# Patient Record
Sex: Male | Born: 1955
Health system: Southern US, Community
[De-identification: ages and names within clinical notes are randomized; demographics above are authoritative.]

## PROBLEM LIST (undated history)

## (undated) DIAGNOSIS — I10 Essential (primary) hypertension: Secondary | ICD-10-CM

## (undated) DIAGNOSIS — E119 Type 2 diabetes mellitus without complications: Secondary | ICD-10-CM

## (undated) HISTORY — PX: NO PAST SURGERIES: SHX2092

---

## 1999-04-01 ENCOUNTER — Encounter: Admission: RE | Admit: 1999-04-01 | Discharge: 1999-06-30 | Payer: Self-pay | Admitting: Family Medicine

## 2004-06-16 ENCOUNTER — Ambulatory Visit: Payer: Self-pay | Admitting: Gastroenterology

## 2004-06-30 ENCOUNTER — Ambulatory Visit: Payer: Self-pay | Admitting: Gastroenterology

## 2004-07-22 ENCOUNTER — Encounter: Admission: RE | Admit: 2004-07-22 | Discharge: 2004-07-22 | Payer: Self-pay | Admitting: Family Medicine

## 2004-10-05 ENCOUNTER — Ambulatory Visit (HOSPITAL_BASED_OUTPATIENT_CLINIC_OR_DEPARTMENT_OTHER): Admission: RE | Admit: 2004-10-05 | Discharge: 2004-10-05 | Payer: Self-pay | Admitting: Family Medicine

## 2004-10-12 ENCOUNTER — Ambulatory Visit: Payer: Self-pay | Admitting: Internal Medicine

## 2009-06-18 ENCOUNTER — Encounter (INDEPENDENT_AMBULATORY_CARE_PROVIDER_SITE_OTHER): Payer: Self-pay | Admitting: *Deleted

## 2010-09-02 NOTE — Letter (Signed)
 Summary: Colonoscopy Date Change Letter  [facility]  [address] [place], [place] [address]   Phone: [phone]  Fax: [phone]      June 18, 2009 MRN: [id number]   Michael Franco [address] [place], [place]  [address]   Dear [name],   Previously you were recommended to have a repeat colonoscopy around this time. Your chart was recently reviewed by Dr. Arvie Franco of [facility]. Follow up colonoscopy is now recommended in Dec 2015 . This revised recommendation is based on current, nationally recognized guidelines for colorectal cancer screening and polyp surveillance. These guidelines are endorsed by the American Cancer Society, The [place]  Multi-Society Task Force on Colorectal Cancer as well as numerous other major medical organizations.  Please understand that our recommendation assumes that you do not have any new symptoms such as bleeding, a change in bowel habits, anemia, or significant abdominal discomfort. If you do have any concerning GI symptoms or want to discuss the guideline recommendations, please call to arrange an office visit at your earliest convenience. Otherwise we will keep you in our reminder system and contact you 1-2 months prior to the date listed above to schedule your next colonoscopy.  Thank you,  Michael Franco. Michael Franco, M.D.  [facility] [phone]  Appended Document: Colonoscopy Date Change Letter 07-02-09 Resent letter with corrected mailing addresss to [address] Michael Franco. Michael Franco

## 2013-06-22 ENCOUNTER — Ambulatory Visit
Admission: RE | Admit: 2013-06-22 | Discharge: 2013-06-22 | Disposition: A | Discharge status: Home / Self Care | Payer: 59 | Source: Ambulatory Visit | Attending: Physician Assistant | Admitting: Physician Assistant

## 2013-06-22 ENCOUNTER — Other Ambulatory Visit: Payer: Self-pay | Admitting: Physician Assistant

## 2013-06-22 DIAGNOSIS — R52 Pain, unspecified: Secondary | ICD-10-CM

## 2014-04-26 ENCOUNTER — Encounter: Payer: Self-pay | Admitting: Gastroenterology

## 2014-12-10 ENCOUNTER — Encounter: Payer: Self-pay | Admitting: Gastroenterology

## 2014-12-31 IMAGING — CR DG TOE GREAT 2+V*R*
5 series · 5 of 5 positions shown · non-contrast
Comparison: None.

CLINICAL DATA: Barbell fell on big toe today with contusion

EXAM:
RIGHT GREAT TOE

[view not recorded (1 of 5)]
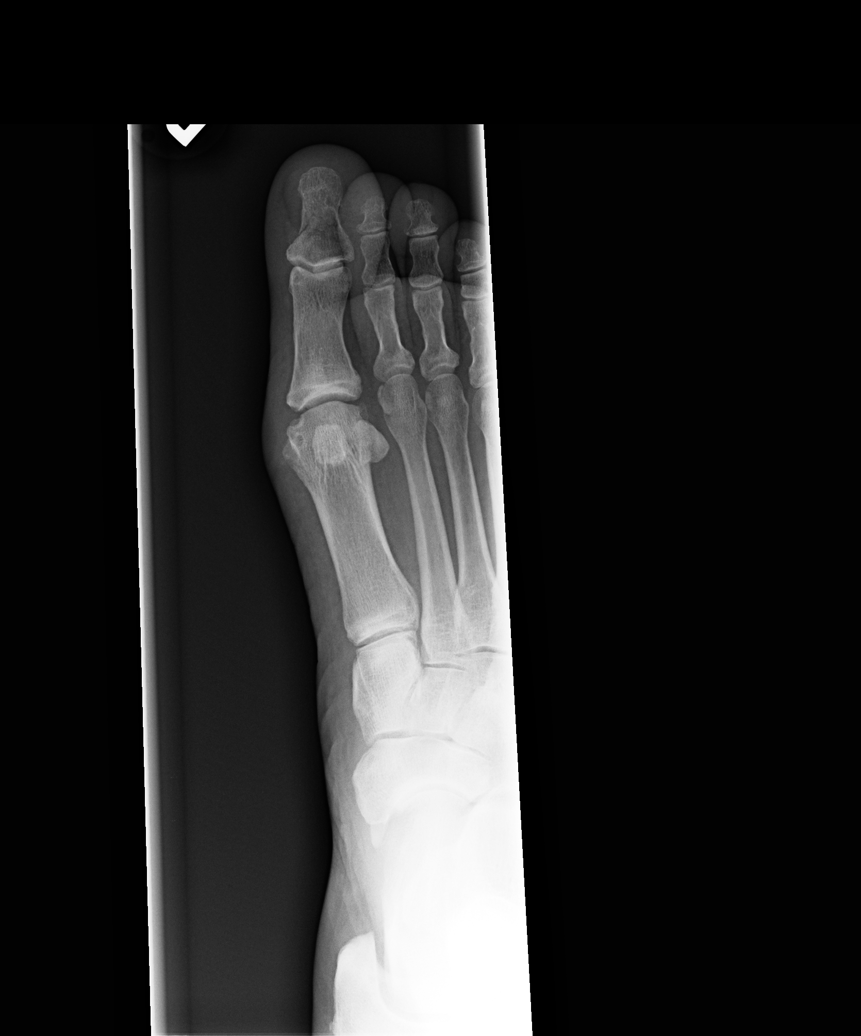

[view not recorded (2 of 5)]
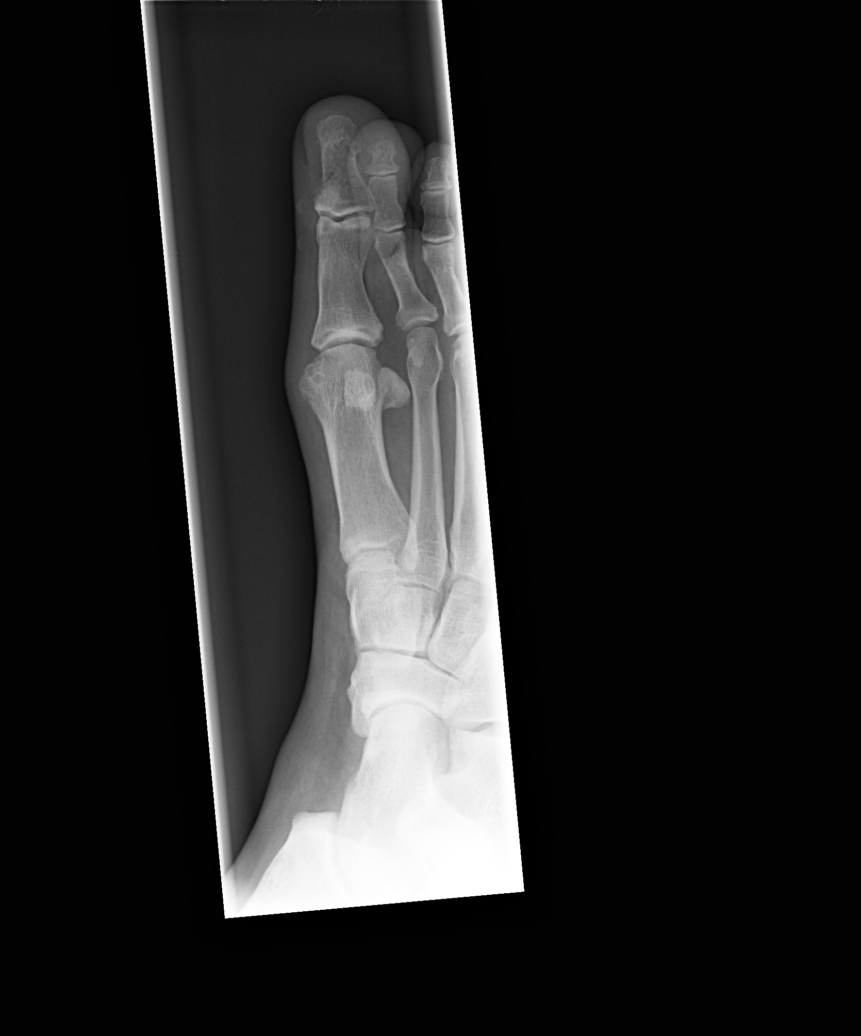

[view not recorded (3 of 5)]
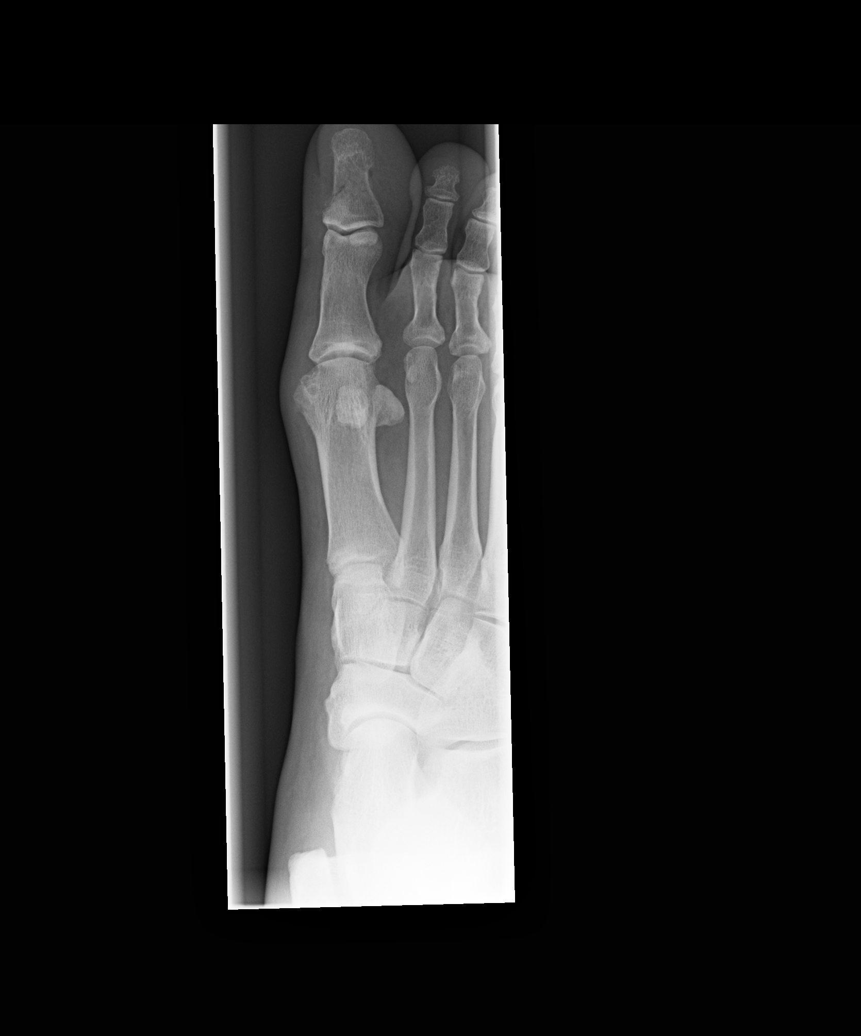

[view not recorded (4 of 5)]
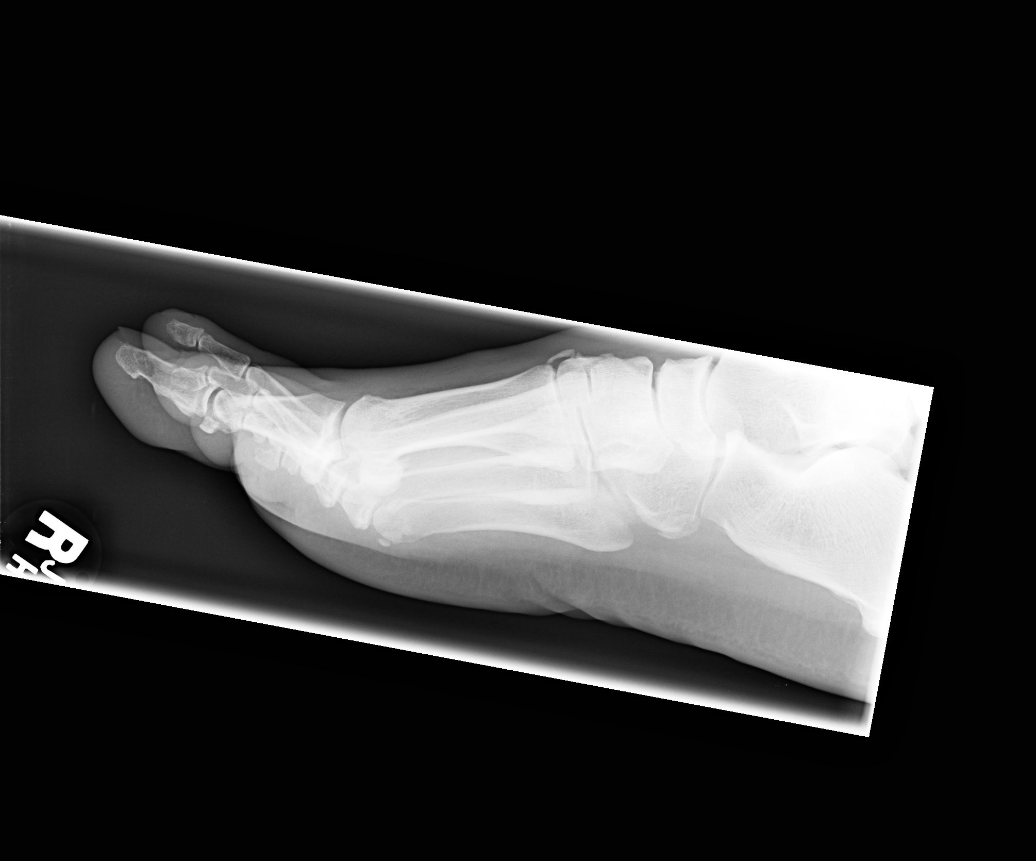

[view not recorded (5 of 5)]
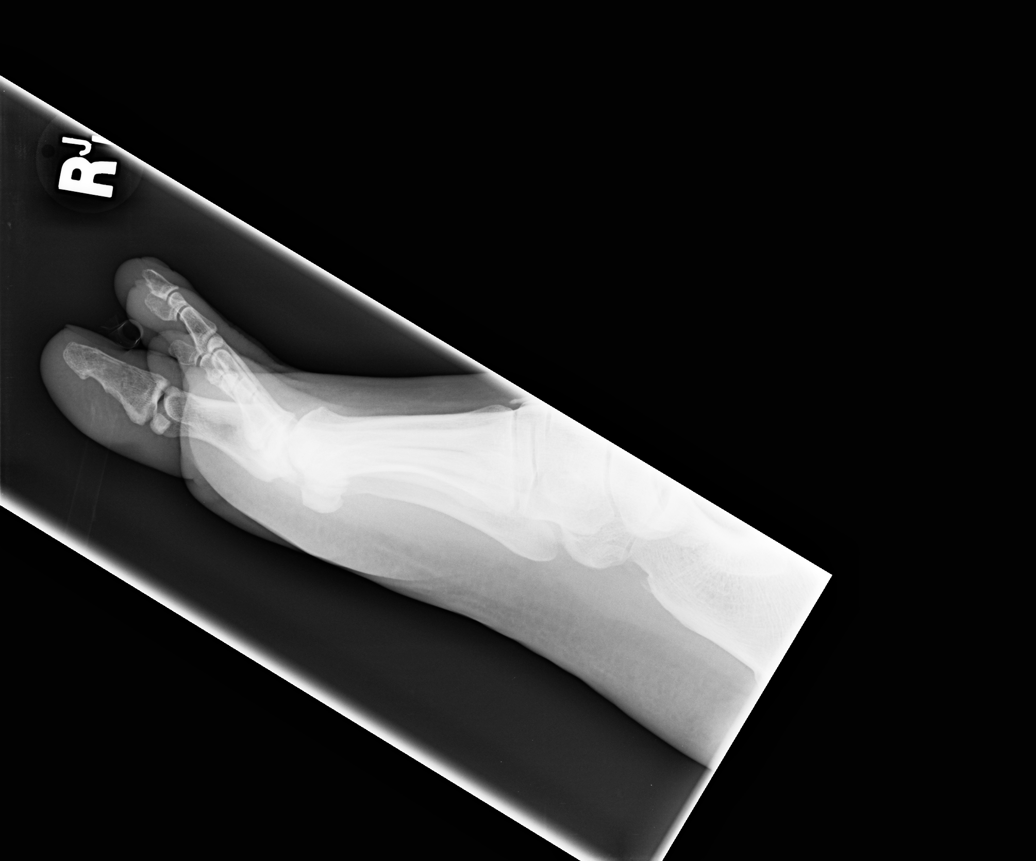

[5 of 5 positions shown; findings below may reference images not displayed]

FINDINGS: There is a comminuted nondisplaced fracture of the mid aspect of the
distal phalanx of the right great toe. This fracture does not appear
to involve the DIP joint space. No other abnormality is seen.
IMPRESSION: Nondisplaced fracture of the midportion of the distal phalanx of the
right great toe.

## 2016-09-03 DIAGNOSIS — G4733 Obstructive sleep apnea (adult) (pediatric): Secondary | ICD-10-CM | POA: Diagnosis not present

## 2016-09-23 DIAGNOSIS — E119 Type 2 diabetes mellitus without complications: Secondary | ICD-10-CM | POA: Diagnosis not present

## 2016-09-23 DIAGNOSIS — E7800 Pure hypercholesterolemia, unspecified: Secondary | ICD-10-CM | POA: Diagnosis not present

## 2016-09-23 DIAGNOSIS — I10 Essential (primary) hypertension: Secondary | ICD-10-CM | POA: Diagnosis not present

## 2016-10-06 DIAGNOSIS — D631 Anemia in chronic kidney disease: Secondary | ICD-10-CM | POA: Diagnosis not present

## 2016-10-06 DIAGNOSIS — N181 Chronic kidney disease, stage 1: Secondary | ICD-10-CM | POA: Diagnosis not present

## 2016-10-06 DIAGNOSIS — N2581 Secondary hyperparathyroidism of renal origin: Secondary | ICD-10-CM | POA: Diagnosis not present

## 2016-10-06 DIAGNOSIS — N189 Chronic kidney disease, unspecified: Secondary | ICD-10-CM | POA: Diagnosis not present

## 2016-12-21 DIAGNOSIS — E1165 Type 2 diabetes mellitus with hyperglycemia: Secondary | ICD-10-CM | POA: Diagnosis not present

## 2016-12-21 DIAGNOSIS — E7800 Pure hypercholesterolemia, unspecified: Secondary | ICD-10-CM | POA: Diagnosis not present

## 2016-12-21 DIAGNOSIS — I10 Essential (primary) hypertension: Secondary | ICD-10-CM | POA: Diagnosis not present

## 2017-01-12 DIAGNOSIS — I10 Essential (primary) hypertension: Secondary | ICD-10-CM | POA: Diagnosis not present

## 2017-01-12 DIAGNOSIS — E7800 Pure hypercholesterolemia, unspecified: Secondary | ICD-10-CM | POA: Diagnosis not present

## 2017-03-03 DIAGNOSIS — G4733 Obstructive sleep apnea (adult) (pediatric): Secondary | ICD-10-CM | POA: Diagnosis not present

## 2017-03-29 DIAGNOSIS — E7800 Pure hypercholesterolemia, unspecified: Secondary | ICD-10-CM | POA: Diagnosis not present

## 2017-03-29 DIAGNOSIS — E1165 Type 2 diabetes mellitus with hyperglycemia: Secondary | ICD-10-CM | POA: Diagnosis not present

## 2017-03-29 DIAGNOSIS — I10 Essential (primary) hypertension: Secondary | ICD-10-CM | POA: Diagnosis not present

## 2017-07-05 DIAGNOSIS — E1165 Type 2 diabetes mellitus with hyperglycemia: Secondary | ICD-10-CM | POA: Diagnosis not present

## 2017-07-05 DIAGNOSIS — M109 Gout, unspecified: Secondary | ICD-10-CM | POA: Diagnosis not present

## 2017-07-05 DIAGNOSIS — I10 Essential (primary) hypertension: Secondary | ICD-10-CM | POA: Diagnosis not present

## 2017-11-12 DIAGNOSIS — D631 Anemia in chronic kidney disease: Secondary | ICD-10-CM | POA: Diagnosis not present

## 2017-11-12 DIAGNOSIS — I10 Essential (primary) hypertension: Secondary | ICD-10-CM | POA: Diagnosis not present

## 2017-11-12 DIAGNOSIS — N2581 Secondary hyperparathyroidism of renal origin: Secondary | ICD-10-CM | POA: Diagnosis not present

## 2017-11-12 DIAGNOSIS — N189 Chronic kidney disease, unspecified: Secondary | ICD-10-CM | POA: Diagnosis not present

## 2017-11-12 DIAGNOSIS — N181 Chronic kidney disease, stage 1: Secondary | ICD-10-CM | POA: Diagnosis not present

## 2018-01-10 DIAGNOSIS — E7800 Pure hypercholesterolemia, unspecified: Secondary | ICD-10-CM | POA: Diagnosis not present

## 2018-01-10 DIAGNOSIS — I10 Essential (primary) hypertension: Secondary | ICD-10-CM | POA: Diagnosis not present

## 2018-01-10 DIAGNOSIS — E119 Type 2 diabetes mellitus without complications: Secondary | ICD-10-CM | POA: Diagnosis not present

## 2018-01-10 DIAGNOSIS — Z125 Encounter for screening for malignant neoplasm of prostate: Secondary | ICD-10-CM | POA: Diagnosis not present

## 2018-03-18 DIAGNOSIS — L509 Urticaria, unspecified: Secondary | ICD-10-CM | POA: Diagnosis not present

## 2018-03-18 DIAGNOSIS — E119 Type 2 diabetes mellitus without complications: Secondary | ICD-10-CM | POA: Diagnosis not present

## 2018-03-22 DIAGNOSIS — L509 Urticaria, unspecified: Secondary | ICD-10-CM | POA: Diagnosis not present

## 2018-07-11 DIAGNOSIS — E785 Hyperlipidemia, unspecified: Secondary | ICD-10-CM | POA: Diagnosis not present

## 2018-07-11 DIAGNOSIS — I10 Essential (primary) hypertension: Secondary | ICD-10-CM | POA: Diagnosis not present

## 2018-07-11 DIAGNOSIS — E119 Type 2 diabetes mellitus without complications: Secondary | ICD-10-CM | POA: Diagnosis not present

## 2018-09-25 ENCOUNTER — Other Ambulatory Visit: Payer: Self-pay

## 2018-09-25 ENCOUNTER — Observation Stay (HOSPITAL_COMMUNITY)
Admission: EM | Admit: 2018-09-25 | Discharge: 2018-09-27 | Disposition: A | Discharge status: Home / Self Care | Payer: 59 | Attending: Internal Medicine | Admitting: Internal Medicine

## 2018-09-25 ENCOUNTER — Emergency Department (HOSPITAL_COMMUNITY): Payer: 59

## 2018-09-25 ENCOUNTER — Encounter (HOSPITAL_COMMUNITY): Payer: Self-pay | Admitting: Emergency Medicine

## 2018-09-25 DIAGNOSIS — R112 Nausea with vomiting, unspecified: Secondary | ICD-10-CM | POA: Diagnosis not present

## 2018-09-25 DIAGNOSIS — N200 Calculus of kidney: Secondary | ICD-10-CM | POA: Diagnosis not present

## 2018-09-25 DIAGNOSIS — D72829 Elevated white blood cell count, unspecified: Secondary | ICD-10-CM | POA: Insufficient documentation

## 2018-09-25 DIAGNOSIS — R1115 Cyclical vomiting syndrome unrelated to migraine: Secondary | ICD-10-CM

## 2018-09-25 DIAGNOSIS — E119 Type 2 diabetes mellitus without complications: Secondary | ICD-10-CM | POA: Insufficient documentation

## 2018-09-25 DIAGNOSIS — R109 Unspecified abdominal pain: Secondary | ICD-10-CM | POA: Diagnosis present

## 2018-09-25 DIAGNOSIS — Z79899 Other long term (current) drug therapy: Secondary | ICD-10-CM | POA: Insufficient documentation

## 2018-09-25 DIAGNOSIS — N210 Calculus in bladder: Secondary | ICD-10-CM | POA: Insufficient documentation

## 2018-09-25 DIAGNOSIS — Z7984 Long term (current) use of oral hypoglycemic drugs: Secondary | ICD-10-CM | POA: Diagnosis not present

## 2018-09-25 DIAGNOSIS — Z7982 Long term (current) use of aspirin: Secondary | ICD-10-CM | POA: Insufficient documentation

## 2018-09-25 DIAGNOSIS — R1114 Bilious vomiting: Secondary | ICD-10-CM

## 2018-09-25 DIAGNOSIS — N289 Disorder of kidney and ureter, unspecified: Secondary | ICD-10-CM

## 2018-09-25 DIAGNOSIS — N132 Hydronephrosis with renal and ureteral calculous obstruction: Secondary | ICD-10-CM | POA: Diagnosis not present

## 2018-09-25 DIAGNOSIS — D649 Anemia, unspecified: Secondary | ICD-10-CM | POA: Diagnosis not present

## 2018-09-25 DIAGNOSIS — I10 Essential (primary) hypertension: Secondary | ICD-10-CM | POA: Insufficient documentation

## 2018-09-25 DIAGNOSIS — E860 Dehydration: Secondary | ICD-10-CM

## 2018-09-25 DIAGNOSIS — R1031 Right lower quadrant pain: Secondary | ICD-10-CM | POA: Diagnosis not present

## 2018-09-25 DIAGNOSIS — R197 Diarrhea, unspecified: Secondary | ICD-10-CM

## 2018-09-25 DIAGNOSIS — N179 Acute kidney failure, unspecified: Secondary | ICD-10-CM | POA: Diagnosis not present

## 2018-09-25 DIAGNOSIS — R1011 Right upper quadrant pain: Secondary | ICD-10-CM

## 2018-09-25 HISTORY — DX: Essential (primary) hypertension: I10

## 2018-09-25 HISTORY — DX: Type 2 diabetes mellitus without complications: E11.9

## 2018-09-25 LAB — CBC
HCT: 36.4 % — ABNORMAL LOW (ref 39.0–52.0)
Hemoglobin: 12.2 g/dL — ABNORMAL LOW (ref 13.0–17.0)
MCH: 30.1 pg (ref 26.0–34.0)
MCHC: 33.5 g/dL (ref 30.0–36.0)
MCV: 89.9 fL (ref 80.0–100.0)
Platelets: 241 10*3/uL (ref 150–400)
RBC: 4.05 MIL/uL — ABNORMAL LOW (ref 4.22–5.81)
RDW: 12.3 % (ref 11.5–15.5)
WBC: 15 10*3/uL — ABNORMAL HIGH (ref 4.0–10.5)
nRBC: 0 % (ref 0.0–0.2)

## 2018-09-25 LAB — URINALYSIS, ROUTINE W REFLEX MICROSCOPIC
Bacteria, UA: NONE SEEN
Bilirubin Urine: NEGATIVE
Glucose, UA: 150 mg/dL — AB
Hgb urine dipstick: NEGATIVE
Ketones, ur: NEGATIVE mg/dL
Leukocytes,Ua: NEGATIVE
Nitrite: NEGATIVE
Protein, ur: 30 mg/dL — AB
Specific Gravity, Urine: 1.014 (ref 1.005–1.030)
pH: 7 (ref 5.0–8.0)

## 2018-09-25 LAB — COMPREHENSIVE METABOLIC PANEL
ALT: 17 U/L (ref 0–44)
AST: 25 U/L (ref 15–41)
Albumin: 4.2 g/dL (ref 3.5–5.0)
Alkaline Phosphatase: 54 U/L (ref 38–126)
Anion gap: 14 (ref 5–15)
BUN: 15 mg/dL (ref 8–23)
CO2: 22 mmol/L (ref 22–32)
Calcium: 9.5 mg/dL (ref 8.9–10.3)
Chloride: 102 mmol/L (ref 98–111)
Creatinine, Ser: 2.42 mg/dL — ABNORMAL HIGH (ref 0.61–1.24)
GFR calc Af Amer: 32 mL/min — ABNORMAL LOW (ref >60)
GFR calc non Af Amer: 27 mL/min — ABNORMAL LOW (ref >60)
Glucose, Bld: 244 mg/dL — ABNORMAL HIGH (ref 70–99)
Potassium: 4.3 mmol/L (ref 3.5–5.1)
Sodium: 138 mmol/L (ref 135–145)
Total Bilirubin: 1 mg/dL (ref 0.3–1.2)
Total Protein: 7.2 g/dL (ref 6.5–8.1)

## 2018-09-25 LAB — LIPASE, BLOOD: Lipase: 19 U/L (ref 11–51)

## 2018-09-25 LAB — GLUCOSE, CAPILLARY
Glucose-Capillary: 194 mg/dL — ABNORMAL HIGH (ref 70–99)
Glucose-Capillary: 150 mg/dL — ABNORMAL HIGH (ref 70–99)

## 2018-09-25 MED ORDER — AMLODIPINE BESYLATE 10 MG PO TABS
10.0000 mg | ORAL_TABLET | Freq: Every day | ORAL | Status: DC
  Administered 2018-09-25 – 2018-09-27 (×3): 10 mg via ORAL
  Filled 2018-09-25 (×2): qty 2
  Filled 2018-09-25: qty 1

## 2018-09-25 MED ORDER — INSULIN ASPART 100 UNIT/ML ~~LOC~~ SOLN: 0.0000 [IU] | Freq: Every day | SUBCUTANEOUS | Status: DC

## 2018-09-25 MED ORDER — ONDANSETRON HCL 4 MG/2ML IJ SOLN
4.0000 mg | Freq: Once | INTRAMUSCULAR | Status: AC
  Administered 2018-09-25: 4 mg via INTRAVENOUS
  Filled 2018-09-25: qty 2

## 2018-09-25 MED ORDER — ACETAMINOPHEN 325 MG PO TABS
650.0000 mg | ORAL_TABLET | Freq: Four times a day (QID) | ORAL | Status: DC | PRN
  Administered 2018-09-25: 650 mg via ORAL
  Filled 2018-09-25: qty 2

## 2018-09-25 MED ORDER — HEPARIN SODIUM (PORCINE) 5000 UNIT/ML IJ SOLN
5000.0000 [IU] | Freq: Three times a day (TID) | INTRAMUSCULAR | Status: DC
  Filled 2018-09-25 (×2): qty 1

## 2018-09-25 MED ORDER — SODIUM CHLORIDE 0.9 % IV BOLUS
1000.0000 mL | Freq: Once | INTRAVENOUS | Status: AC
  Administered 2018-09-25: 1000 mL via INTRAVENOUS

## 2018-09-25 MED ORDER — ACETAMINOPHEN 650 MG RE SUPP: 650.0000 mg | Freq: Four times a day (QID) | RECTAL | Status: DC | PRN

## 2018-09-25 MED ORDER — SODIUM CHLORIDE 0.9% FLUSH: 3.0000 mL | Freq: Once | INTRAVENOUS | Status: DC

## 2018-09-25 MED ORDER — SODIUM CHLORIDE 0.9 % IV SOLN
INTRAVENOUS | Status: DC
  Administered 2018-09-25 – 2018-09-27 (×4): via INTRAVENOUS

## 2018-09-25 MED ORDER — ONDANSETRON HCL 4 MG/2ML IJ SOLN
4.0000 mg | Freq: Once | INTRAMUSCULAR | Status: AC | PRN
  Administered 2018-09-25: 4 mg via INTRAVENOUS
  Filled 2018-09-25: qty 2

## 2018-09-25 MED ORDER — MORPHINE SULFATE (PF) 4 MG/ML IV SOLN
4.0000 mg | Freq: Once | INTRAVENOUS | Status: AC
  Administered 2018-09-25: 4 mg via INTRAVENOUS
  Filled 2018-09-25: qty 1

## 2018-09-25 MED ORDER — DOXAZOSIN MESYLATE 4 MG PO TABS
4.0000 mg | ORAL_TABLET | Freq: Every day | ORAL | Status: DC
  Administered 2018-09-25 – 2018-09-27 (×3): 4 mg via ORAL
  Filled 2018-09-25: qty 1
  Filled 2018-09-25 (×2): qty 2
  Filled 2018-09-25 (×2): qty 1

## 2018-09-25 MED ORDER — ATENOLOL 50 MG PO TABS
50.0000 mg | ORAL_TABLET | Freq: Every day | ORAL | Status: DC
  Administered 2018-09-25 – 2018-09-27 (×3): 50 mg via ORAL
  Filled 2018-09-25 (×3): qty 1

## 2018-09-25 MED ORDER — TAMSULOSIN HCL 0.4 MG PO CAPS
0.4000 mg | ORAL_CAPSULE | Freq: Every day | ORAL | Status: DC
  Administered 2018-09-25: 0.4 mg via ORAL
  Filled 2018-09-25: qty 1

## 2018-09-25 MED ORDER — ASPIRIN EC 81 MG PO TBEC
81.0000 mg | DELAYED_RELEASE_TABLET | Freq: Every day | ORAL | Status: DC
  Administered 2018-09-25 – 2018-09-27 (×3): 81 mg via ORAL
  Filled 2018-09-25 (×3): qty 1

## 2018-09-25 MED ORDER — TRAMADOL HCL 50 MG PO TABS
50.0000 mg | ORAL_TABLET | Freq: Four times a day (QID) | ORAL | Status: DC | PRN
  Administered 2018-09-25 – 2018-09-26 (×2): 50 mg via ORAL
  Filled 2018-09-25 (×3): qty 1

## 2018-09-25 MED ORDER — ONDANSETRON HCL 4 MG PO TABS: 4.0000 mg | ORAL_TABLET | Freq: Four times a day (QID) | ORAL | Status: DC | PRN

## 2018-09-25 MED ORDER — INSULIN ASPART 100 UNIT/ML ~~LOC~~ SOLN
0.0000 [IU] | Freq: Three times a day (TID) | SUBCUTANEOUS | Status: DC
  Administered 2018-09-25: 1 [IU] via SUBCUTANEOUS
  Administered 2018-09-25: 2 [IU] via SUBCUTANEOUS
  Administered 2018-09-26: 3 [IU] via SUBCUTANEOUS
  Administered 2018-09-27: 2 [IU] via SUBCUTANEOUS

## 2018-09-25 MED ORDER — ONDANSETRON HCL 4 MG/2ML IJ SOLN
4.0000 mg | Freq: Four times a day (QID) | INTRAMUSCULAR | Status: DC | PRN
  Administered 2018-09-26 (×2): 4 mg via INTRAVENOUS
  Filled 2018-09-25 (×2): qty 2

## 2018-09-25 MED ORDER — METOCLOPRAMIDE HCL 5 MG/ML IJ SOLN
10.0000 mg | Freq: Once | INTRAMUSCULAR | Status: AC
  Administered 2018-09-25: 10 mg via INTRAVENOUS
  Filled 2018-09-25: qty 2

## 2018-09-25 NOTE — Progress Notes (Signed)
Pt educated on needing to strain all urine  Provided pt with urinal and stated to call RN when he voids  Pt verbalizes understanding

## 2018-09-25 NOTE — Consult Note (Signed)
Urology Consult   Physician requesting consult: Dr. Jerral RalphGhimire  Reason for consult: AKI and right ureteral stone  History of Present Illness: Michael LoweMichael Franco is a 63 y.o. with no prior history of known urolithiasis who presented to the ED today with complaints of severe RLQ abdominal pain associated with nausea and vomiting.  He has denied any fever.  CT imaging revealed a small 2-3 mm stone at the right UVJ along with another 5 mm left sided bladder stone that appeared to be away from the left ureteral orifice.  There was right sided hydronephrosis.  His pain was able to be controlled but he was noted to have an elevated Cr of 2.4 and was admitted by Medicine for this reason.  His baseline Cr is not available but he states that he has never been told that he had abnormal renal function.  He last saw Dr. Holley Boucheandall Franco, his PCP, for a routine physician a few months ago and presumably had normal renal function at that time.  He does have a history of diabetes and hypertension.  He already takes an alpha blocker for hypertension.  He denies a history of voiding or storage urinary symptoms, hematuria, UTIs, STDs, urolithiasis, GU malignancy/trauma/surgery.  Past Medical History:  Diagnosis Date  . Diabetes mellitus without complication (HCC)   . Hypertension     History reviewed. No pertinent surgical history.  Current Hospital Medications:  Home Meds:  No current facility-administered medications on file prior to encounter.    Current Outpatient Medications on File Prior to Encounter  Medication Sig Dispense Refill  . amLODipine (NORVASC) 10 MG tablet Take 10 mg by mouth daily.    Marland Kitchen. aspirin EC 81 MG tablet Take 81 mg by mouth daily.    Marland Kitchen. atenolol (TENORMIN) 50 MG tablet Take 50 mg by mouth daily.    Marland Kitchen. doxazosin (CARDURA) 4 MG tablet Take 4 mg by mouth daily.    . irbesartan (AVAPRO) 300 MG tablet Take 300 mg by mouth daily.    . metFORMIN (GLUCOPHAGE) 1000 MG tablet Take 1,000 mg by mouth 2  (two) times daily.       Scheduled Meds: . amLODipine  10 mg Oral Daily  . aspirin EC  81 mg Oral Daily  . atenolol  50 mg Oral Daily  . doxazosin  4 mg Oral Daily  . heparin  5,000 Units Subcutaneous Q8H  . insulin aspart  0-5 Units Subcutaneous QHS  . insulin aspart  0-9 Units Subcutaneous TID WC  . sodium chloride flush  3 mL Intravenous Once  . tamsulosin  0.4 mg Oral QPC supper   Continuous Infusions: . sodium chloride 125 mL/hr at 09/25/18 1203   PRN Meds:.acetaminophen **OR** acetaminophen, ondansetron **OR** ondansetron (ZOFRAN) IV, traMADol  Allergies: No Known Allergies  History reviewed. No pertinent family history.  Social History:  reports that he has never smoked. He has never used smokeless tobacco. He reports that he does not drink alcohol or use drugs.  ROS: A complete review of systems was performed.  All systems are negative except for pertinent findings as noted.  Physical Exam:  Vital signs in last 24 hours: Temp:  [98.3 F (36.8 C)-99.1 F (37.3 C)] 99.1 F (37.3 C) (02/23 1147) Pulse Rate:  [70-87] 70 (02/23 1147) Resp:  [18] 18 (02/23 1147) BP: (170-179)/(85-92) 172/89 (02/23 1147) SpO2:  [95 %-99 %] 97 % (02/23 1147) Weight:  [81.6 kg] 81.6 kg (02/23 0640) Constitutional:  Alert and oriented, No acute distress Cardiovascular:  Regular rate and rhythm, No JVD Respiratory: Normal respiratory effort, Lungs clear bilaterally GI: Abdomen is soft, nontender, nondistended, no abdominal masses GU: No CVA tenderness Lymphatic: No lymphadenopathy Neurologic: Grossly intact, no focal deficits Psychiatric: Normal mood and affect  Laboratory Data:  Recent Labs    09/25/18 0643  WBC 15.0*  HGB 12.2*  HCT 36.4*  PLT 241    Recent Labs    09/25/18 0643  NA 138  K 4.3  CL 102  GLUCOSE 244*  BUN 15  CALCIUM 9.5  CREATININE 2.42*     Results for orders placed or performed during the hospital encounter of 09/25/18 (from the past 24 hour(s))   Lipase, blood     Status: None   Collection Time: 09/25/18  6:43 AM  Result Value Ref Range   Lipase 19 11 - 51 U/L  Comprehensive metabolic panel     Status: Abnormal   Collection Time: 09/25/18  6:43 AM  Result Value Ref Range   Sodium 138 135 - 145 mmol/L   Potassium 4.3 3.5 - 5.1 mmol/L   Chloride 102 98 - 111 mmol/L   CO2 22 22 - 32 mmol/L   Glucose, Bld 244 (H) 70 - 99 mg/dL   BUN 15 8 - 23 mg/dL   Creatinine, Ser 4.98 (H) 0.61 - 1.24 mg/dL   Calcium 9.5 8.9 - 26.4 mg/dL   Total Protein 7.2 6.5 - 8.1 g/dL   Albumin 4.2 3.5 - 5.0 g/dL   AST 25 15 - 41 U/L   ALT 17 0 - 44 U/L   Alkaline Phosphatase 54 38 - 126 U/L   Total Bilirubin 1.0 0.3 - 1.2 mg/dL   GFR calc non Af Amer 27 (L) >60 mL/min   GFR calc Af Amer 32 (L) >60 mL/min   Anion gap 14 5 - 15  CBC     Status: Abnormal   Collection Time: 09/25/18  6:43 AM  Result Value Ref Range   WBC 15.0 (H) 4.0 - 10.5 K/uL   RBC 4.05 (L) 4.22 - 5.81 MIL/uL   Hemoglobin 12.2 (L) 13.0 - 17.0 g/dL   HCT 15.8 (L) 30.9 - 40.7 %   MCV 89.9 80.0 - 100.0 fL   MCH 30.1 26.0 - 34.0 pg   MCHC 33.5 30.0 - 36.0 g/dL   RDW 68.0 88.1 - 10.3 %   Platelets 241 150 - 400 K/uL   nRBC 0.0 0.0 - 0.2 %  Urinalysis, Routine w reflex microscopic     Status: Abnormal   Collection Time: 09/25/18  9:26 AM  Result Value Ref Range   Color, Urine YELLOW YELLOW   APPearance CLEAR CLEAR   Specific Gravity, Urine 1.014 1.005 - 1.030   pH 7.0 5.0 - 8.0   Glucose, UA 150 (A) NEGATIVE mg/dL   Hgb urine dipstick NEGATIVE NEGATIVE   Bilirubin Urine NEGATIVE NEGATIVE   Ketones, ur NEGATIVE NEGATIVE mg/dL   Protein, ur 30 (A) NEGATIVE mg/dL   Nitrite NEGATIVE NEGATIVE   Leukocytes,Ua NEGATIVE NEGATIVE   RBC / HPF 0-5 0 - 5 RBC/hpf   WBC, UA 0-5 0 - 5 WBC/hpf   Bacteria, UA NONE SEEN NONE SEEN   Squamous Epithelial / LPF 0-5 0 - 5   Mucus PRESENT   Glucose, capillary     Status: Abnormal   Collection Time: 09/25/18 11:46 AM  Result Value Ref Range    Glucose-Capillary 194 (H) 70 - 99 mg/dL   No results found for this  or any previous visit (from the past 240 hour(s)).  Renal Function: Recent Labs    09/25/18 0643  CREATININE 2.42*   Estimated Creatinine Clearance: 30.8 mL/min (A) (by C-G formula based on SCr of 2.42 mg/dL (H)).  Radiologic Imaging: Ct Abdomen Pelvis Wo Contrast  Result Date: 09/25/2018 CLINICAL DATA:  Right-sided pain with nausea and vomiting since 3 a.m. EXAM: CT ABDOMEN AND PELVIS WITHOUT CONTRAST TECHNIQUE: Multidetector CT imaging of the abdomen and pelvis was performed following the standard protocol without IV contrast. COMPARISON:  07/22/2004 CTA FINDINGS: Lower chest: Clear lung bases. Normal heart size without pericardial or pleural effusion. Tiny hiatal hernia. Hepatobiliary: Normal liver. Normal gallbladder, without biliary ductal dilatation. Pancreas: Normal, without mass or ductal dilatation. Spleen: Normal in size, without focal abnormality. Adrenals/Urinary Tract: Normal adrenal glands. Possible punctate lower pole left renal collecting system calculus. Moderate right sided perinephric edema with mild hydronephrosis and hydroureter. The hydroureter is followed to the level of the urinary bladder. There are 2 bladder stones, including at 3 mm in the right paracentral bladder base and 5 mm in the left bladder base. Stomach/Bowel: Normal remainder of the stomach. Normal colon, appendix, and terminal ileum. Normal small bowel. Vascular/Lymphatic: Aortic and branch vessel atherosclerosis. No abdominopelvic adenopathy. Reproductive: Mild prostatomegaly. Other: No significant free fluid. Periumbilical fat containing ventral abdominal wall laxity is tiny. Musculoskeletal: No acute osseous abnormality. IMPRESSION: 1. Two bladder calculi. These have presumably recently passed from the right kidney, given right perinephric edema and hydroureteronephrosis. 2. Possible left nephrolithiasis. 3.  Aortic Atherosclerosis  (ICD10-I70.0). 4.  Tiny hiatal hernia. Electronically Signed   By: Jeronimo Greaves M.D.   On: 09/25/2018 09:10    I independently reviewed the above imaging studies.  Impression/Recommendation 1) Right ureteral stone:  His stone is likely to pass.  Recommend IVF hydration and pain control.  He is already on alpha blocker therapy for his hypertension.  Strain urine.  Will re-evaluate in AM (keep NPO after MN).  If his renal function is improving and his pain remains controlled, he can likely be discharged with plans for outpatient follow up.  If his renal function is not improved, he will need to be transferred to [facility] with plans for ureteral stent placement tomorrow.    Crecencio Mc 09/25/2018, 12:42 PM    Moody Bruins MD   CC: Dr. Jerral Ralph

## 2018-09-25 NOTE — ED Notes (Signed)
Pt resting and denies nausea at this time. Update provided.

## 2018-09-25 NOTE — Progress Notes (Signed)
Tray brought to pt room containing meat and dairy Called dietary and nutrition to inform of pt preferred diet- vegan Dietary/nutrition aware  It was previously documented under flow sheet nutritional assessment when completing admission database  Added note under pt heart healthy diet order as well Pt understanding  Will continue to monitor

## 2018-09-25 NOTE — Progress Notes (Signed)
Pt states he had recent travel, 2 weeks ago, to [place] and [place]

## 2018-09-25 NOTE — Progress Notes (Signed)
Pt oriented to the room, call light, phone and emesis bag within reach Pt denies falling within the last six months  CBG checked 194 Vitals documented  Pt denies having any home medications today  Awaiting pharmacy to verify  Will continue to monitor

## 2018-09-25 NOTE — H&P (Signed)
History and Physical    Michael Franco AXK:[id number] DOB: Apr 11, 1956 DOA: 09/25/2018  PCP: Johny Blamer, MD  Patient coming from: home   I have personally briefly reviewed patient's old medical records available.   Chief Complaint: Nausea vomiting and abdominal pain.  HPI: Michael Franco is a 63 y.o. male with medical history significant of type 2 diabetes on metformin, hypertension, gout who presented to the emergency room with 1 day history of severe abdominal pain, multiple episodes of nausea and vomiting and diarrhea.  According to the patient, he started all of a sudden about 2 PM yesterday having right flank and right upper quadrant abdominal pain, 10 out of 10, severe, followed by multiple episodes of vomiting mostly ingested food, he had 3 or 4 episodes of loose stool, no radiation of the pain, no change in urine habits.  He did have less urination overnight though.  No fever or chills.  Did not see any changes in his urine.  Did not see any stones or change in color.  No history of abdominal pain. ED Course: Blood pressures elevated.  WBC count 15,000.  Creatinine is 2.42, baseline unknown.  Glucose is 244.  CTscan of the abdomen and pelvis shows 2 small bladder stones, some perirenal inflammation.  Treated symptomatically in the ER with nausea medications pain medications and IV fluids with good clinical improvement.  Review of Systems: As per HPI otherwise 10 point review of systems negative.    Past Medical History:  Diagnosis Date  . Diabetes mellitus without complication (HCC)   . Hypertension     History reviewed. No pertinent surgical history.   reports that he has never smoked. He has never used smokeless tobacco. He reports that he does not drink alcohol or use drugs.  No Known Allergies  History reviewed. No pertinent family history.  No history of kidney stones or renal disease.   Prior to Admission medications   Medication Sig Start Date End Date Taking?  Authorizing Provider  amLODipine (NORVASC) 10 MG tablet Take 10 mg by mouth daily. 08/24/18  Yes [provider]  aspirin EC 81 MG tablet Take 81 mg by mouth daily.   Yes [provider]  atenolol (TENORMIN) 50 MG tablet Take 50 mg by mouth daily. 09/01/18  Yes [provider]  doxazosin (CARDURA) 4 MG tablet Take 4 mg by mouth daily. 08/24/18  Yes [provider]  irbesartan (AVAPRO) 300 MG tablet Take 300 mg by mouth daily. 09/01/18  Yes [provider]  metFORMIN (GLUCOPHAGE) 1000 MG tablet Take 1,000 mg by mouth 2 (two) times daily. 09/24/18  Yes [provider]    Physical Exam: Vitals:   09/25/18 0730 09/25/18 0745 09/25/18 0800 09/25/18 0815  BP: (!) 174/88 (!) 174/91 (!) 175/85 (!) 174/87  Pulse: 87 76 79 71  Resp:      Temp:      TempSrc:      SpO2: 97% 95% 95% 95%  Weight:      Height:        Constitutional: NAD, calm, comfortable Vitals:   09/25/18 0730 09/25/18 0745 09/25/18 0800 09/25/18 0815  BP: (!) 174/88 (!) 174/91 (!) 175/85 (!) 174/87  Pulse: 87 76 79 71  Resp:      Temp:      TempSrc:      SpO2: 97% 95% 95% 95%  Weight:      Height:       Eyes: PERRL, lids and conjunctivae normal ENMT: Mucous  membranes are moist. Posterior pharynx clear of any exudate or lesions.Normal dentition.  Neck: normal, supple, no masses, no thyromegaly Respiratory: clear to auscultation bilaterally, no wheezing, no crackles. Normal respiratory effort. No accessory muscle use.  Cardiovascular: Regular rate and rhythm, no murmurs / rubs / gallops. No extremity edema. 2+ pedal pulses. No carotid bruits.  Abdomen: no tenderness, no masses palpated. No hepatosplenomegaly. Bowel sounds positive.  No rigidity or guarding. Musculoskeletal: no clubbing / cyanosis. No joint deformity upper and lower extremities. Good ROM, no contractures. Normal muscle tone.  Skin: no rashes, lesions, ulcers. No induration Neurologic: CN 2-12 grossly intact.  Sensation intact, DTR normal. Strength 5/5 in all 4.  Psychiatric: Normal judgment and insight. Alert and oriented x 3. Normal mood.     Labs on Admission: I have personally reviewed following labs and imaging studies  CBC: Recent Labs  Lab 09/25/18 0643  WBC 15.0*  HGB 12.2*  HCT 36.4*  MCV 89.9  PLT 241   Basic Metabolic Panel: Recent Labs  Lab 09/25/18 0643  NA 138  K 4.3  CL 102  CO2 22  GLUCOSE 244*  BUN 15  CREATININE 2.42*  CALCIUM 9.5   GFR: Estimated Creatinine Clearance: 30.8 mL/min (A) (by C-G formula based on SCr of 2.42 mg/dL (H)). Liver Function Tests: Recent Labs  Lab 09/25/18 0643  AST 25  ALT 17  ALKPHOS 54  BILITOT 1.0  PROT 7.2  ALBUMIN 4.2   Recent Labs  Lab 09/25/18 0643  LIPASE 19   No results for input(s): AMMONIA in the last 168 hours. Coagulation Profile: No results for input(s): INR, PROTIME in the last 168 hours. Cardiac Enzymes: No results for input(s): CKTOTAL, CKMB, CKMBINDEX, TROPONINI in the last 168 hours. BNP (last 3 results) No results for input(s): PROBNP in the last 8760 hours. HbA1C: No results for input(s): HGBA1C in the last 72 hours. CBG: No results for input(s): GLUCAP in the last 168 hours. Lipid Profile: No results for input(s): CHOL, HDL, LDLCALC, TRIG, CHOLHDL, LDLDIRECT in the last 72 hours. Thyroid Function Tests: No results for input(s): TSH, T4TOTAL, FREET4, T3FREE, THYROIDAB in the last 72 hours. Anemia Panel: No results for input(s): VITAMINB12, FOLATE, FERRITIN, TIBC, IRON, RETICCTPCT in the last 72 hours. Urine analysis:    Component Value Date/Time   COLORURINE YELLOW 09/25/2018 0926   APPEARANCEUR CLEAR 09/25/2018 0926   LABSPEC 1.014 09/25/2018 0926   PHURINE 7.0 09/25/2018 0926   GLUCOSEU 150 (A) 09/25/2018 0926   HGBUR NEGATIVE 09/25/2018 0926   BILIRUBINUR NEGATIVE 09/25/2018 0926   KETONESUR NEGATIVE 09/25/2018 0926   PROTEINUR 30 (A) 09/25/2018 0926   NITRITE NEGATIVE 09/25/2018  0926   LEUKOCYTESUR NEGATIVE 09/25/2018 0926    Radiological Exams on Admission: Ct Abdomen Pelvis Wo Contrast  Result Date: 09/25/2018 CLINICAL DATA:  Right-sided pain with nausea and vomiting since 3 a.m. EXAM: CT ABDOMEN AND PELVIS WITHOUT CONTRAST TECHNIQUE: Multidetector CT imaging of the abdomen and pelvis was performed following the standard protocol without IV contrast. COMPARISON:  07/22/2004 CTA FINDINGS: Lower chest: Clear lung bases. Normal heart size without pericardial or pleural effusion. Tiny hiatal hernia. Hepatobiliary: Normal liver. Normal gallbladder, without biliary ductal dilatation. Pancreas: Normal, without mass or ductal dilatation. Spleen: Normal in size, without focal abnormality. Adrenals/Urinary Tract: Normal adrenal glands. Possible punctate lower pole left renal collecting system calculus. Moderate right sided perinephric edema with mild hydronephrosis and hydroureter. The hydroureter is followed to the level of the urinary bladder. There are 2 bladder  stones, including at 3 mm in the right paracentral bladder base and 5 mm in the left bladder base. Stomach/Bowel: Normal remainder of the stomach. Normal colon, appendix, and terminal ileum. Normal small bowel. Vascular/Lymphatic: Aortic and branch vessel atherosclerosis. No abdominopelvic adenopathy. Reproductive: Mild prostatomegaly. Other: No significant free fluid. Periumbilical fat containing ventral abdominal wall laxity is tiny. Musculoskeletal: No acute osseous abnormality. IMPRESSION: 1. Two bladder calculi. These have presumably recently passed from the right kidney, given right perinephric edema and hydroureteronephrosis. 2. Possible left nephrolithiasis. 3.  Aortic Atherosclerosis (ICD10-I70.0). 4.  Tiny hiatal hernia. Electronically Signed   By: Jeronimo Greaves M.D.   On: 09/25/2018 09:10    Assessment/Plan Principal Problem:   Nausea and vomiting Active Problems:   Abdominal pain   Type 2 diabetes mellitus  without complication (HCC)   Essential hypertension   AKI (acute kidney injury) (HCC)     1.  Severe abdominal pain, nausea vomiting with associated nephrolithiasis: Clinically improving.  Admit.  Nausea has improved.  Will allow diet.  Adequate IV fluids.  Encourage oral fluids. Adequate pain medications. We will start patient on Flomax. We will discuss with urology whether patient needs any intervention. Currently no indication to start patient on antibiotics.  2.  Nephrolithiasis, bladder calculi and suspected left nephrolithiasis: IV fluids.  Pain medications.  Will discuss with urology. I called and discussed case with Dr. Laverle Patter from urology.  Patient does have 5 mm right VUJ stone.  Will hydrate.  He started on Flomax.  Will be seen by urology.  Will keep n.p.o. past midnight, if recurrence of pain and no improvement of renal functions, may need cystoscopy.  3.  Acute kidney injury: Unknown whether patient has any chronic kidney disease.  Probably due to above.  Hydrate aggressively and recheck levels in the morning.  4.  Hypertension: Blood pressure stable.  Resume home medications.  Hold ACE inhibitor because of acute kidney injury.  5.  Type 2 diabetes: On metformin at home.  Hold metformin due to acute kidney injury.  Will treat with sliding scale insulin in the hospital.  Patient is stated well controlled.    DVT prophylaxis: Subcu heparin. Code Status: Full code. Family Communication: Wife and daughter at the bedside. Disposition Plan: Home when is stable. Consults called: Urology. Admission status: Observation.   Dorcas Carrow MD [facility] Pager [phone]  If 7PM-7AM, please contact night-coverage [email or web address] Password [email or web address]  09/25/2018, 11:18 AM

## 2018-09-25 NOTE — ED Notes (Signed)
Pt transported to CT ?

## 2018-09-25 NOTE — ED Triage Notes (Signed)
Reports n/v/d and abdominal pain since 0200 this morning.  Describes pain as sharp. Pointing mainly in right side of abdomen going into right side.  Denies going into the back.

## 2018-09-25 NOTE — ED Provider Notes (Signed)
[facility] EMERGENCY DEPARTMENT Provider Note   CSN: [id number] Arrival date & time: 09/25/18  4098    History   Chief Complaint Chief Complaint  Patient presents with  . Abdominal Pain  . Emesis    HPI Michael Franco is a 63 y.o. male.     Patient c/o nausea, vomiting and abd pain yesterday afternoon. 10+ episodes emesis, not bloody or bilious. 2-3 episodes diarrhea, not bloody. abd pain, crampy, dull, diffuse, constant, non radiating, worse on right. No hx same. No known bad food ingestion. No ill contacts. No fever. No prior abd surgery. No distension. No chest pain or sob. No cough or uri c/o. No dysuria or gu c/o.   The history is provided by the patient.  Abdominal Pain  Associated symptoms: diarrhea and vomiting   Associated symptoms: no chest pain, no dysuria, no fever, no hematuria, no shortness of breath and no sore throat   Emesis  Associated symptoms: abdominal pain and diarrhea   Associated symptoms: no fever, no headaches and no sore throat     Past Medical History:  Diagnosis Date  . Diabetes mellitus without complication (HCC)   . Hypertension     There are no active problems to display for this patient.   History reviewed. No pertinent surgical history.      Home Medications    Prior to Admission medications   Not on File    Family History No family history on file.  Social History Social History   Tobacco Use  . Smoking status: Never Smoker  . Smokeless tobacco: Never Used  Substance Use Topics  . Alcohol use: Never    Frequency: Never  . Drug use: Never     Allergies   Patient has no allergy information on record.   Review of Systems Review of Systems  Constitutional: Negative for fever.  HENT: Negative for sore throat.   Eyes: Negative for redness.  Respiratory: Negative for shortness of breath.   Cardiovascular: Negative for chest pain.  Gastrointestinal: Positive for abdominal pain, diarrhea and  vomiting.  Endocrine: Negative for polyuria.  Genitourinary: Negative for dysuria, flank pain and hematuria.  Musculoskeletal: Negative for back pain and neck pain.  Skin: Negative for rash.  Neurological: Negative for headaches.  Hematological: Does not bruise/bleed easily.  Psychiatric/Behavioral: Negative for confusion.     Physical Exam Updated Vital Signs BP (!) 175/88   Pulse 81   Temp 98.7 F (37.1 C) (Oral)   Resp 18   Ht 1.753 m ( )   Wt 81.6 kg   SpO2 98%   BMI 26.58 kg/m   Physical Exam Vitals signs and nursing note reviewed.  Constitutional:      Appearance: Normal appearance. He is well-developed.  HENT:     Head: Atraumatic.     Nose: Nose normal.     Mouth/Throat:     Mouth: Mucous membranes are moist.     Pharynx: Oropharynx is clear.  Eyes:     General: No scleral icterus.    Conjunctiva/sclera: Conjunctivae normal.     Pupils: Pupils are equal, round, and reactive to light.  Neck:     Musculoskeletal: Normal range of motion and neck supple. No neck rigidity.     Trachea: No tracheal deviation.  Cardiovascular:     Rate and Rhythm: Normal rate and regular rhythm.     Pulses: Normal pulses.     Heart sounds: Normal heart sounds. No murmur. No friction rub. No  gallop.   Pulmonary:     Effort: Pulmonary effort is normal. No accessory muscle usage or respiratory distress.     Breath sounds: Normal breath sounds.  Abdominal:     General: Bowel sounds are normal. There is no distension.     Palpations: Abdomen is soft. There is no mass.     Tenderness: There is abdominal tenderness. There is no guarding.     Hernia: No hernia is present.     Comments: Moderate right abd tenderness.   Genitourinary:    Comments: No cva tenderness. Musculoskeletal:        General: No swelling.  Skin:    General: Skin is warm and dry.     Findings: No rash.  Neurological:     Mental Status: He is alert.     Comments: Alert, speech clear.   Psychiatric:         Mood and Affect: Mood normal.      ED Treatments / Results  Labs (all labs ordered are listed, but only abnormal results are displayed) Results for orders placed or performed during the hospital encounter of 09/25/18  Lipase, blood  Result Value Ref Range   Lipase 19 11 - 51 U/L  Comprehensive metabolic panel  Result Value Ref Range   Sodium 138 135 - 145 mmol/L   Potassium 4.3 3.5 - 5.1 mmol/L   Chloride 102 98 - 111 mmol/L   CO2 22 22 - 32 mmol/L   Glucose, Bld 244 (H) 70 - 99 mg/dL   BUN 15 8 - 23 mg/dL   Creatinine, Ser 9.77 (H) 0.61 - 1.24 mg/dL   Calcium 9.5 8.9 - 41.4 mg/dL   Total Protein 7.2 6.5 - 8.1 g/dL   Albumin 4.2 3.5 - 5.0 g/dL   AST 25 15 - 41 U/L   ALT 17 0 - 44 U/L   Alkaline Phosphatase 54 38 - 126 U/L   Total Bilirubin 1.0 0.3 - 1.2 mg/dL   GFR calc non Af Amer 27 (L) >60 mL/min   GFR calc Af Amer 32 (L) >60 mL/min   Anion gap 14 5 - 15  CBC  Result Value Ref Range   WBC 15.0 (H) 4.0 - 10.5 K/uL   RBC 4.05 (L) 4.22 - 5.81 MIL/uL   Hemoglobin 12.2 (L) 13.0 - 17.0 g/dL   HCT 23.9 (L) 53.2 - 02.3 %   MCV 89.9 80.0 - 100.0 fL   MCH 30.1 26.0 - 34.0 pg   MCHC 33.5 30.0 - 36.0 g/dL   RDW 34.3 56.8 - 61.6 %   Platelets 241 150 - 400 K/uL   nRBC 0.0 0.0 - 0.2 %  Urinalysis, Routine w reflex microscopic  Result Value Ref Range   Color, Urine YELLOW YELLOW   APPearance CLEAR CLEAR   Specific Gravity, Urine 1.014 1.005 - 1.030   pH 7.0 5.0 - 8.0   Glucose, UA 150 (A) NEGATIVE mg/dL   Hgb urine dipstick NEGATIVE NEGATIVE   Bilirubin Urine NEGATIVE NEGATIVE   Ketones, ur NEGATIVE NEGATIVE mg/dL   Protein, ur 30 (A) NEGATIVE mg/dL   Nitrite NEGATIVE NEGATIVE   Leukocytes,Ua NEGATIVE NEGATIVE   RBC / HPF 0-5 0 - 5 RBC/hpf   WBC, UA 0-5 0 - 5 WBC/hpf   Bacteria, UA NONE SEEN NONE SEEN   Squamous Epithelial / LPF 0-5 0 - 5   Mucus PRESENT     EKG None  Radiology Ct Abdomen Pelvis Wo Contrast  Result Date:  09/25/2018 CLINICAL DATA:   Right-sided pain with nausea and vomiting since 3 a.m. EXAM: CT ABDOMEN AND PELVIS WITHOUT CONTRAST TECHNIQUE: Multidetector CT imaging of the abdomen and pelvis was performed following the standard protocol without IV contrast. COMPARISON:  07/22/2004 CTA FINDINGS: Lower chest: Clear lung bases. Normal heart size without pericardial or pleural effusion. Tiny hiatal hernia. Hepatobiliary: Normal liver. Normal gallbladder, without biliary ductal dilatation. Pancreas: Normal, without mass or ductal dilatation. Spleen: Normal in size, without focal abnormality. Adrenals/Urinary Tract: Normal adrenal glands. Possible punctate lower pole left renal collecting system calculus. Moderate right sided perinephric edema with mild hydronephrosis and hydroureter. The hydroureter is followed to the level of the urinary bladder. There are 2 bladder stones, including at 3 mm in the right paracentral bladder base and 5 mm in the left bladder base. Stomach/Bowel: Normal remainder of the stomach. Normal colon, appendix, and terminal ileum. Normal small bowel. Vascular/Lymphatic: Aortic and branch vessel atherosclerosis. No abdominopelvic adenopathy. Reproductive: Mild prostatomegaly. Other: No significant free fluid. Periumbilical fat containing ventral abdominal wall laxity is tiny. Musculoskeletal: No acute osseous abnormality. IMPRESSION: 1. Two bladder calculi. These have presumably recently passed from the right kidney, given right perinephric edema and hydroureteronephrosis. 2. Possible left nephrolithiasis. 3.  Aortic Atherosclerosis (ICD10-I70.0). 4.  Tiny hiatal hernia. Electronically Signed   By: Jeronimo Greaves M.D.   On: 09/25/2018 09:10    Procedures Procedures (including critical care time)  Medications Ordered in ED Medications  sodium chloride flush (NS) 0.9 % injection 3 mL (has no administration in time range)  sodium chloride 0.9 % bolus 1,000 mL (has no administration in time range)  morphine 4 MG/ML  injection 4 mg (has no administration in time range)  ondansetron (ZOFRAN) injection 4 mg (has no administration in time range)  ondansetron (ZOFRAN) injection 4 mg (4 mg Intravenous Given 09/25/18 0649)     Initial Impression / Assessment and Plan / ED Course  I have reviewed the triage vital signs and the nursing notes.  Pertinent labs & imaging results that were available during my care of the patient were reviewed by me and considered in my medical decision making (see chart for details).  Iv ns bolus. Morphine iv. zofran iv. Labs.   Reviewed nursing notes and prior charts for additional history.   Ct imaging ordered.   Ct reviewed - c/w recent passed ureteral stone.   Labs reviewed - cr is elevated - no prior to compare. Pt does note hx dm or htn, but states never recalls being told elevated kidney function.   Patient with recurrent nv despite two dose zofran, ivf.   Additional ns iv. reglan iv.   Given '20' episodes emesis, recurrent emesis in ED, elev cr, will consult medicine for admission.  Pt [facility].   Final Clinical Impressions(s) / ED Diagnoses   Final diagnoses:  None    ED Discharge Orders    None       Cathren Laine, MD 09/25/18 1011

## 2018-09-25 NOTE — ED Notes (Signed)
Admitting Dr bedside 

## 2018-09-26 ENCOUNTER — Observation Stay (HOSPITAL_COMMUNITY): Payer: 59 | Admitting: Anesthesiology

## 2018-09-26 ENCOUNTER — Encounter (HOSPITAL_COMMUNITY): Payer: Self-pay | Admitting: *Deleted

## 2018-09-26 ENCOUNTER — Encounter (HOSPITAL_COMMUNITY)
Admission: EM | Disposition: A | Discharge status: Home / Self Care | Payer: Self-pay | Source: Home / Self Care | Attending: Emergency Medicine

## 2018-09-26 ENCOUNTER — Observation Stay (HOSPITAL_COMMUNITY): Payer: 59

## 2018-09-26 DIAGNOSIS — N179 Acute kidney failure, unspecified: Secondary | ICD-10-CM | POA: Diagnosis not present

## 2018-09-26 DIAGNOSIS — R1031 Right lower quadrant pain: Secondary | ICD-10-CM | POA: Diagnosis not present

## 2018-09-26 DIAGNOSIS — N201 Calculus of ureter: Secondary | ICD-10-CM | POA: Diagnosis not present

## 2018-09-26 DIAGNOSIS — R109 Unspecified abdominal pain: Secondary | ICD-10-CM

## 2018-09-26 DIAGNOSIS — I10 Essential (primary) hypertension: Secondary | ICD-10-CM | POA: Diagnosis not present

## 2018-09-26 DIAGNOSIS — E119 Type 2 diabetes mellitus without complications: Secondary | ICD-10-CM

## 2018-09-26 DIAGNOSIS — R112 Nausea with vomiting, unspecified: Secondary | ICD-10-CM | POA: Diagnosis not present

## 2018-09-26 DIAGNOSIS — N132 Hydronephrosis with renal and ureteral calculous obstruction: Secondary | ICD-10-CM | POA: Diagnosis not present

## 2018-09-26 DIAGNOSIS — N200 Calculus of kidney: Secondary | ICD-10-CM | POA: Diagnosis not present

## 2018-09-26 DIAGNOSIS — N210 Calculus in bladder: Secondary | ICD-10-CM | POA: Diagnosis not present

## 2018-09-26 HISTORY — PX: CYSTOSCOPY/URETEROSCOPY/HOLMIUM LASER/STENT PLACEMENT: SHX6546

## 2018-09-26 LAB — BASIC METABOLIC PANEL
Anion gap: 8 (ref 5–15)
BUN: 13 mg/dL (ref 8–23)
Calcium: 8.6 mg/dL — ABNORMAL LOW (ref 8.9–10.3)
Chloride: 106 mmol/L (ref 98–111)
Creatinine, Ser: 2.75 mg/dL — ABNORMAL HIGH (ref 0.61–1.24)
GFR calc Af Amer: 27 mL/min — ABNORMAL LOW (ref >60)
GFR calc non Af Amer: 23 mL/min — ABNORMAL LOW (ref >60)
Glucose, Bld: 186 mg/dL — ABNORMAL HIGH (ref 70–99)
Potassium: 4.3 mmol/L (ref 3.5–5.1)
Sodium: 137 mmol/L (ref 135–145)

## 2018-09-26 LAB — CBC
HCT: 31.1 % — ABNORMAL LOW (ref 39.0–52.0)
Hemoglobin: 10.4 g/dL — ABNORMAL LOW (ref 13.0–17.0)
MCH: 29.7 pg (ref 26.0–34.0)
MCHC: 33.4 g/dL (ref 30.0–36.0)
MCV: 88.9 fL (ref 80.0–100.0)
Platelets: 210 10*3/uL (ref 150–400)
RBC: 3.5 MIL/uL — ABNORMAL LOW (ref 4.22–5.81)
RDW: 12.4 % (ref 11.5–15.5)
WBC: 13.3 10*3/uL — ABNORMAL HIGH (ref 4.0–10.5)
nRBC: 0 % (ref 0.0–0.2)

## 2018-09-26 LAB — GLUCOSE, CAPILLARY
Glucose-Capillary: 168 mg/dL — ABNORMAL HIGH (ref 70–99)
Glucose-Capillary: 169 mg/dL — ABNORMAL HIGH (ref 70–99)
Glucose-Capillary: 177 mg/dL — ABNORMAL HIGH (ref 70–99)
Glucose-Capillary: 206 mg/dL — ABNORMAL HIGH (ref 70–99)
Glucose-Capillary: 179 mg/dL — ABNORMAL HIGH (ref 70–99)
Glucose-Capillary: 194 mg/dL — ABNORMAL HIGH (ref 70–99)

## 2018-09-26 LAB — BASIC METABOLIC PANEL WITH GFR: CO2: 23 mmol/L (ref 22–32)

## 2018-09-26 LAB — HIV ANTIBODY (ROUTINE TESTING W REFLEX): HIV Screen 4th Generation wRfx: NONREACTIVE

## 2018-09-26 LAB — MRSA PCR SCREENING: MRSA by PCR: NEGATIVE

## 2018-09-26 SURGERY — CYSTOSCOPY/URETEROSCOPY/HOLMIUM LASER/STENT PLACEMENT
Anesthesia: General | Site: Ureter

## 2018-09-26 MED ORDER — DEXAMETHASONE SODIUM PHOSPHATE 10 MG/ML IJ SOLN
INTRAMUSCULAR | Status: DC | PRN
  Administered 2018-09-26: 4 mg via INTRAVENOUS

## 2018-09-26 MED ORDER — OXYCODONE HCL 5 MG/5ML PO SOLN: 5.0000 mg | Freq: Once | ORAL | Status: DC | PRN

## 2018-09-26 MED ORDER — FENTANYL CITRATE (PF) 100 MCG/2ML IJ SOLN: 25.0000 ug | INTRAMUSCULAR | Status: DC | PRN

## 2018-09-26 MED ORDER — MORPHINE SULFATE (PF) 2 MG/ML IV SOLN
1.0000 mg | INTRAVENOUS | Status: DC | PRN
  Administered 2018-09-26 (×2): 1 mg via INTRAVENOUS
  Filled 2018-09-26 (×2): qty 1

## 2018-09-26 MED ORDER — OXYCODONE HCL 5 MG PO TABS: 5.0000 mg | ORAL_TABLET | Freq: Once | ORAL | Status: DC | PRN

## 2018-09-26 MED ORDER — HYDRALAZINE HCL 20 MG/ML IJ SOLN: 10.0000 mg | Freq: Three times a day (TID) | INTRAMUSCULAR | Status: DC | PRN

## 2018-09-26 MED ORDER — PROMETHAZINE HCL 25 MG/ML IJ SOLN: 6.2500 mg | INTRAMUSCULAR | Status: DC | PRN

## 2018-09-26 MED ORDER — PROPOFOL 10 MG/ML IV BOLUS
INTRAVENOUS | Status: DC | PRN
  Administered 2018-09-26: 40 mg via INTRAVENOUS
  Administered 2018-09-26: 160 mg via INTRAVENOUS

## 2018-09-26 MED ORDER — ACETAMINOPHEN 10 MG/ML IV SOLN: 1000.0000 mg | Freq: Once | INTRAVENOUS | Status: DC | PRN

## 2018-09-26 MED ORDER — FENTANYL CITRATE (PF) 100 MCG/2ML IJ SOLN
INTRAMUSCULAR | Status: DC | PRN
  Administered 2018-09-26: 50 ug via INTRAVENOUS

## 2018-09-26 MED ORDER — ONDANSETRON HCL 4 MG/2ML IJ SOLN
INTRAMUSCULAR | Status: AC
  Filled 2018-09-26: qty 2

## 2018-09-26 MED ORDER — EPHEDRINE SULFATE-NACL 50-0.9 MG/10ML-% IV SOSY
PREFILLED_SYRINGE | INTRAVENOUS | Status: DC | PRN
  Administered 2018-09-26 (×3): 10 mg via INTRAVENOUS

## 2018-09-26 MED ORDER — SODIUM CHLORIDE 0.9 % IR SOLN
Status: DC | PRN
  Administered 2018-09-26: 3000 mL

## 2018-09-26 MED ORDER — MORPHINE SULFATE (PF) 4 MG/ML IV SOLN
4.0000 mg | Freq: Once | INTRAVENOUS | Status: AC
  Administered 2018-09-26: 4 mg via INTRAVENOUS
  Filled 2018-09-26: qty 1

## 2018-09-26 MED ORDER — PROPOFOL 10 MG/ML IV BOLUS
INTRAVENOUS | Status: AC
  Filled 2018-09-26: qty 20

## 2018-09-26 MED ORDER — LIDOCAINE 2% (20 MG/ML) 5 ML SYRINGE
INTRAMUSCULAR | Status: DC | PRN
  Administered 2018-09-26: 40 mg via INTRAVENOUS
  Administered 2018-09-26: 60 mg via INTRAVENOUS

## 2018-09-26 MED ORDER — FENTANYL CITRATE (PF) 100 MCG/2ML IJ SOLN
INTRAMUSCULAR | Status: AC
  Filled 2018-09-26: qty 2

## 2018-09-26 MED ORDER — CEFAZOLIN SODIUM-DEXTROSE 2-4 GM/100ML-% IV SOLN
INTRAVENOUS | Status: AC
  Filled 2018-09-26: qty 100

## 2018-09-26 MED ORDER — ONDANSETRON HCL 4 MG/2ML IJ SOLN
INTRAMUSCULAR | Status: DC | PRN
  Administered 2018-09-26: 4 mg via INTRAVENOUS

## 2018-09-26 MED ORDER — IOHEXOL 300 MG/ML  SOLN
INTRAMUSCULAR | Status: DC | PRN
  Administered 2018-09-26: 5 mL via URETHRAL

## 2018-09-26 MED ORDER — LACTATED RINGERS IV SOLN
INTRAVENOUS | Status: DC
  Administered 2018-09-26: 18:00:00 via INTRAVENOUS

## 2018-09-26 MED ORDER — CEFAZOLIN SODIUM-DEXTROSE 2-4 GM/100ML-% IV SOLN
2.0000 g | Freq: Once | INTRAVENOUS | Status: AC
  Administered 2018-09-26: 2 g via INTRAVENOUS

## 2018-09-26 SURGICAL SUPPLY — 21 items
BAG URO CATCHER STRL LF (MISCELLANEOUS) ×2 IMPLANT
BASKET ZERO TIP NITINOL 2.4FR (BASKET) IMPLANT
BSKT STON RTRVL ZERO TP 2.4FR (BASKET)
CATH INTERMIT  6FR 70CM (CATHETERS) ×1 IMPLANT
CLOTH BEACON ORANGE TIMEOUT ST (SAFETY) ×2 IMPLANT
COVER WAND RF STERILE (DRAPES) IMPLANT
FIBER LASER FLEXIVA 365 (UROLOGICAL SUPPLIES) IMPLANT
FIBER LASER TRAC TIP (UROLOGICAL SUPPLIES) IMPLANT
GLOVE BIOGEL M STRL SZ7.5 (GLOVE) ×2 IMPLANT
GOWN STRL REUS W/TWL LRG LVL3 (GOWN DISPOSABLE) ×4 IMPLANT
GUIDEWIRE ANG ZIPWIRE 038X150 (WIRE) ×1 IMPLANT
GUIDEWIRE STR DUAL SENSOR (WIRE) ×2 IMPLANT
IV NS 1000ML (IV SOLUTION) ×2
IV NS 1000ML BAXH (IV SOLUTION) ×1 IMPLANT
MANIFOLD NEPTUNE II (INSTRUMENTS) ×2 IMPLANT
PACK CYSTO (CUSTOM PROCEDURE TRAY) ×2 IMPLANT
SHEATH URETERAL 12FRX35CM (MISCELLANEOUS) IMPLANT
STENT CONTOUR 6FRX24X.038 (STENTS) ×1 IMPLANT
STENT URET 6FRX24 CONTOUR (STENTS) ×1 IMPLANT
TUBING CONNECTING 10 (TUBING) ×2 IMPLANT
TUBING UROLOGY SET (TUBING) ×2 IMPLANT

## 2018-09-26 NOTE — Progress Notes (Signed)
Received pt to room 1440 via [facility] transport, VS obtained, IVFs infusing, oriented to unit, call light placed in reach

## 2018-09-26 NOTE — Anesthesia Procedure Notes (Signed)
Procedure Name: LMA Insertion Date/Time: 09/26/2018 6:57 PM Performed by: Wynonia Sours, CRNA Pre-anesthesia Checklist: Patient identified, Emergency Drugs available, Suction available, Patient being monitored and Timeout performed Patient Re-evaluated:Patient Re-evaluated prior to induction Oxygen Delivery Method: Circle system utilized Preoxygenation: Pre-oxygenation with 100% oxygen Induction Type: IV induction LMA: LMA with gastric port inserted LMA Size: 5.0 Grade View: Grade I Number of attempts: 1 Placement Confirmation: positive ETCO2 and breath sounds checked- equal and bilateral Tube secured with: Tape Dental Injury: Teeth and Oropharynx as per pre-operative assessment

## 2018-09-26 NOTE — Progress Notes (Signed)
Report called and given to receiving nurse Cala Bradford at [facility] [address]. Transport pending per [facility].

## 2018-09-26 NOTE — Progress Notes (Signed)
Patient ID: Michael Franco, male   DOB: Mar 06, 1956, 63 y.o.   MRN: [id number]    Subjective: Pt still with RLQ pain and nausea overnight.  Objective: Vital signs in last 24 hours: Temp:  [98.1 F (36.7 C)-99.1 F (37.3 C)] 98.1 F (36.7 C) (02/23 2357) Pulse Rate:  [66-87] 66 (02/23 2357) Resp:  [17-18] 18 (02/23 2357) BP: (135-175)/(81-91) 156/81 (02/23 2357) SpO2:  [95 %-97 %] 95 % (02/23 2357)  Intake/Output from previous day: 02/23 0701 - 02/24 0700 In: 985 [P.O.:360; I.V.:625] Out: 900 [Urine:900] Intake/Output this shift: No intake/output data recorded.  Physical Exam:  General: Alert and oriented Abdomen: Soft, ND, Tender over RLQ, minimal CVAT   Lab Results: Recent Labs    09/25/18 0643 09/26/18 0553  HGB 12.2* 10.4*  HCT 36.4* 31.1*   CBC Latest Ref Rng & Units 09/26/2018 09/25/2018  WBC 4.0 - 10.5 K/uL 13.3(H) 15.0(H)  Hemoglobin 13.0 - 17.0 g/dL 10.4(L) 12.2(L)  Hematocrit 39.0 - 52.0 % 31.1(L) 36.4(L)  Platelets 150 - 400 K/uL 210 241     BMET Recent Labs    09/25/18 0643 09/26/18 0553  NA 138 137  K 4.3 4.3  CL 102 106  CO2 22 23  GLUCOSE 244* 186*  BUN 15 13  CREATININE 2.42* 2.75*  CALCIUM 9.5 8.6*     Studies/Results:   Assessment/Plan: 1) Right distal ureteral stone and AKI: Cr rising.  I have recommended he proceed with cystoscopy, right ureteroscopic stone removal and right ureteral stent.  Will also make sure there is no obstruction of the left side intraoperatively.  We have reviewed this plan. I discussed the potential benefits and risks of the procedure, side effects of the proposed treatment, the likelihood of the patient achieving the goals of the procedure, and any potential problems that might occur during the procedure or recuperation.  Will keep NPO and recommend he be transferred to [facility] so that he can undergo urologic surgery later today.  Will continue to monitor his renal function post-stent and he can  likely be discharged once his renal function is improving.   LOS: 0 days   Crecencio Mc 09/26/2018, 7:20 AM

## 2018-09-26 NOTE — Transfer of Care (Signed)
Immediate Anesthesia Transfer of Care Note  Patient: Michael Franco  Procedure(s) Performed: CYSTOSCOPY/URETEROSCOPY/RETROGRADE PYELOGRAM, STENT PLACEMENT,RIGHT (N/A Ureter)  Patient Location: PACU  Anesthesia Type:General  Level of Consciousness: awake, alert , oriented and patient cooperative  Airway & Oxygen Therapy: Patient Spontanous Breathing and Patient connected to face mask oxygen  Post-op Assessment: Report given to RN and Post -op Vital signs reviewed and stable  Post vital signs: Reviewed and stable  Last Vitals:  Vitals Value Taken Time  BP 176/97 09/26/2018  7:45 PM  Temp 37.1 C 09/26/2018  7:45 PM  Pulse 79 09/26/2018  7:47 PM  Resp 18 09/26/2018  7:47 PM  SpO2 98 % 09/26/2018  7:47 PM  Vitals shown include unvalidated device data.  Last Pain:  Vitals:   09/26/18 1726  TempSrc:   PainSc: 8       Patients Stated Pain Goal: 4 (09/26/18 1726)  Complications: No apparent anesthesia complications

## 2018-09-26 NOTE — Progress Notes (Signed)
Progress Note    Michael Franco  ZOX:[id number] DOB: 1955/10/12  DOA: 09/25/2018 PCP: Johny Blamer, MD    Brief Narrative:    Medical records reviewed and are as summarized below:  Michael Franco is an 63 y.o. male  with medical history significant of type 2 diabetes on metformin, hypertension, gout who presented to the emergency room with 1 day history of severe abdominal pain, multiple episodes of nausea and vomiting and diarrhea.  According to the patient, he started all of a sudden about 2 PM yesterday having right flank and right upper quadrant abdominal pain, 10 out of 10.  Assessment/Plan:   Principal Problem:   Nausea and vomiting Active Problems:   Abdominal pain   Type 2 diabetes mellitus without complication (HCC)   Essential hypertension   AKI (acute kidney injury) (HCC)  Right distal ureteral stone: - IV fluids. And pain medications.   -per nephrology: recommended he proceed with cystoscopy, right ureteroscopic stone removal and right ureteral stent.  Will also make sure there is no obstruction of the left side intraoperatively.   Acute kidney injury:  -unknown baseline in our system -will request recent BMP from Dr. Tiburcio Pea  Hypertension:  - Hold ACE inhibitor because of acute kidney injury. -pain control  Type 2 diabetes: -On metformin at home.   -Hold metformin due to acute kidney injury -SSI for now  Patient to be transferred to [facility] for procedure.  Discussed with Dr. Janee Morn  Family Communication/Anticipated D/C date and plan/Code Status   DVT prophylaxis: sq heparin Code Status: Full Code.  Family Communication: none at bedside Disposition Plan: to [facility] for urologic procedure   Medical Consultants:    urology   Subjective:   C/o right sided pain  Objective:    Vitals:   09/25/18 0815 09/25/18 1147 09/25/18 1625 09/25/18 2357  BP: (!) 174/87 (!) 172/89 135/84 (!) 156/81  Pulse: 71 70 69 66  Resp:  Temp:   99.1 F (37.3 C)  98.1 F (36.7 C)  TempSrc:  Oral Oral Oral  SpO2: 95% 97% 96% 95%  Weight:      Height:        Intake/Output Summary (Last 24 hours) at 09/26/2018 8119 Last data filed at 09/26/2018 0200 Gross per 24 hour  Intake 985 ml  Output 900 ml  Net 85 ml   Filed Weights   09/25/18 0640  Weight: 81.6 kg    Exam: In bed, NAD rrr No increased work of breathing A+OX3 guarded  Data Reviewed:   I have personally reviewed following labs and imaging studies:  Labs: Labs show the following:   Basic Metabolic Panel: Recent Labs  Lab 09/25/18 0643 09/26/18 0553  NA 138 137  K 4.3 4.3  CL 102 106  CO2 22 23  GLUCOSE 244* 186*  BUN 15 13  CREATININE 2.42* 2.75*  CALCIUM 9.5 8.6*   GFR Estimated Creatinine Clearance: 27.1 mL/min (A) (by C-G formula based on SCr of 2.75 mg/dL (H)). Liver Function Tests: Recent Labs  Lab 09/25/18 0643  AST 25  ALT 17  ALKPHOS 54  BILITOT 1.0  PROT 7.2  ALBUMIN 4.2   Recent Labs  Lab 09/25/18 0643  LIPASE 19   No results for input(s): AMMONIA in the last 168 hours. Coagulation profile No results for input(s): INR, PROTIME in the last 168 hours.  CBC: Recent Labs  Lab 09/25/18 0643 09/26/18 0553  WBC 15.0* 13.3*  HGB 12.2* 10.4*  HCT 36.4* 31.1*  MCV 89.9 88.9  PLT 241 210   Cardiac Enzymes: No results for input(s): CKTOTAL, CKMB, CKMBINDEX, TROPONINI in the last 168 hours. BNP (last 3 results) No results for input(s): PROBNP in the last 8760 hours. CBG: Recent Labs  Lab 09/25/18 1146 09/25/18 1622 09/26/18 0803  GLUCAP 194* 150* 168*   D-Dimer: No results for input(s): DDIMER in the last 72 hours. Hgb A1c: No results for input(s): HGBA1C in the last 72 hours. Lipid Profile: No results for input(s): CHOL, HDL, LDLCALC, TRIG, CHOLHDL, LDLDIRECT in the last 72 hours. Thyroid function studies: No results for input(s): TSH, T4TOTAL, T3FREE, THYROIDAB in the last 72 hours.  Invalid input(s):  FREET3 Anemia work up: No results for input(s): VITAMINB12, FOLATE, FERRITIN, TIBC, IRON, RETICCTPCT in the last 72 hours. Sepsis Labs: Recent Labs  Lab 09/25/18 0643 09/26/18 0553  WBC 15.0* 13.3*    Microbiology No results found for this or any previous visit (from the past 240 hour(s)).  Procedures and diagnostic studies:  Ct Abdomen Pelvis Wo Contrast  Result Date: 09/25/2018 CLINICAL DATA:  Right-sided pain with nausea and vomiting since 3 a.m. EXAM: CT ABDOMEN AND PELVIS WITHOUT CONTRAST TECHNIQUE: Multidetector CT imaging of the abdomen and pelvis was performed following the standard protocol without IV contrast. COMPARISON:  07/22/2004 CTA FINDINGS: Lower chest: Clear lung bases. Normal heart size without pericardial or pleural effusion. Tiny hiatal hernia. Hepatobiliary: Normal liver. Normal gallbladder, without biliary ductal dilatation. Pancreas: Normal, without mass or ductal dilatation. Spleen: Normal in size, without focal abnormality. Adrenals/Urinary Tract: Normal adrenal glands. Possible punctate lower pole left renal collecting system calculus. Moderate right sided perinephric edema with mild hydronephrosis and hydroureter. The hydroureter is followed to the level of the urinary bladder. There are 2 bladder stones, including at 3 mm in the right paracentral bladder base and 5 mm in the left bladder base. Stomach/Bowel: Normal remainder of the stomach. Normal colon, appendix, and terminal ileum. Normal small bowel. Vascular/Lymphatic: Aortic and branch vessel atherosclerosis. No abdominopelvic adenopathy. Reproductive: Mild prostatomegaly. Other: No significant free fluid. Periumbilical fat containing ventral abdominal wall laxity is tiny. Musculoskeletal: No acute osseous abnormality. IMPRESSION: 1. Two bladder calculi. These have presumably recently passed from the right kidney, given right perinephric edema and hydroureteronephrosis. 2. Possible left nephrolithiasis. 3.  Aortic  Atherosclerosis (ICD10-I70.0). 4.  Tiny hiatal hernia. Electronically Signed   By: Jeronimo Greaves M.D.   On: 09/25/2018 09:10    Medications:   . amLODipine  10 mg Oral Daily  . aspirin EC  81 mg Oral Daily  . atenolol  50 mg Oral Daily  . doxazosin  4 mg Oral Daily  . heparin  5,000 Units Subcutaneous Q8H  . insulin aspart  0-5 Units Subcutaneous QHS  . insulin aspart  0-9 Units Subcutaneous TID WC  . sodium chloride flush  3 mL Intravenous Once  . tamsulosin  0.4 mg Oral QPC supper   Continuous Infusions: . sodium chloride 125 mL/hr at 09/25/18 1203     LOS: 0 days   Joseph Art  [facility]   How to contact the [facility] Attending or Consulting provider 7A - 7P or covering provider during after hours 7P -7A, for this patient?  1. Check the care team in [facility] and look for a) attending/consulting [facility] provider listed and b) the [facility] team listed 2. Log into [email or web address] and use [facility]'s universal password to access. If you do not have the password, please contact the  hospital operator. 3. Locate the [facility] provider you are looking for under [facility] and page to a number that you can be directly reached. 4. If you still have difficulty reaching the provider, please page the [facility] (Director on Call) for the Hospitalists listed on amion for assistance.  09/26/2018, 8:12 AM

## 2018-09-26 NOTE — Anesthesia Preprocedure Evaluation (Addendum)
Anesthesia Evaluation  Patient identified by MRN, date of birth, ID band Patient awake    Reviewed: Allergy & Precautions, NPO status , Patient's Chart, lab work & pertinent test results, reviewed documented beta blocker date and time   History of Anesthesia Complications Negative for: history of anesthetic complications  Airway Mallampati: II  TM Distance: >3 FB Neck ROM: Full    Dental  (+) Teeth Intact, Dental Advisory Given   Pulmonary neg pulmonary ROS,    Pulmonary exam normal breath sounds clear to auscultation       Cardiovascular hypertension, Pt. on medications and Pt. on home beta blockers Normal cardiovascular exam Rhythm:Regular Rate:Normal     Neuro/Psych negative neurological ROS     GI/Hepatic negative GI ROS, Neg liver ROS,   Endo/Other  diabetes, Type 2, Oral Hypoglycemic Agents  Renal/GU ARFRenal diseaseRight ureteral stone, Cr 2.75     Musculoskeletal negative musculoskeletal ROS (+)   Abdominal   Peds  Hematology  (+) anemia , Hgb 10.4   Anesthesia Other Findings Day of surgery medications reviewed with the patient.  Reproductive/Obstetrics                            Anesthesia Physical Anesthesia Plan  ASA: II and emergent  Anesthesia Plan: General   Post-op Pain Management:    Induction: Intravenous  PONV Risk Score and Plan: 2 and Treatment may vary due to age or medical condition, Ondansetron and Dexamethasone  Airway Management Planned: LMA  Additional Equipment:   Intra-op Plan:   Post-operative Plan: Extubation in OR  Informed Consent: I have reviewed the patients History and Physical, chart, labs and discussed the procedure including the risks, benefits and alternatives for the proposed anesthesia with the patient or authorized representative who has indicated his/her understanding and acceptance.     Dental advisory given  Plan Discussed  with: CRNA  Anesthesia Plan Comments:      Anesthesia Quick Evaluation

## 2018-09-26 NOTE — Op Note (Signed)
Preoperative diagnosis: Right ureteral calculus, bladder calculus  Postoperative diagnosis: Right ureteral calculus, bladder calculus  Procedure:  1. Cystoscopy 2. Right ureteroscopy and stone removal 3. Right ureteral stent placement (6 x 24 - no string) 4. Right retrograde pyelography with interpretation  Surgeon: Moody Bruins. M.D.  Anesthesia: General  Complications: None  Intraoperative findings: Right retrograde pyelography demonstrated a filling defect within the distal ureter consistent with the patient's known calculus without other abnormalities.  EBL: Minimal  Specimens: 1. Right ureteral and bladder calculi  Disposition of specimens: [facility] for stone analysis  Indication: Michael Franco is a 63 y.o. year old patient with urolithiasis and acute kidney injury. After reviewing the management options for treatment, the patient elected to proceed with the above surgical procedure(s). We have discussed the potential benefits and risks of the procedure, side effects of the proposed treatment, the likelihood of the patient achieving the goals of the procedure, and any potential problems that might occur during the procedure or recuperation. Informed consent has been obtained.  Description of procedure:  The patient was taken to the operating room and general anesthesia was induced.  The patient was placed in the dorsal lithotomy position, prepped and draped in the usual sterile fashion, and preoperative antibiotics were administered. A preoperative time-out was performed.   Cystourethroscopy was performed.  The patient's urethra was examined and demonstrated bilobar prostatic hypertrophy. The bladder was then systematically examined in its entirety. There was no evidence for any bladder tumors, stones, or other mucosal pathology.    Attention then turned to the right ureteral orifice and a ureteral catheter was used to intubate the ureteral orifice.   Omnipaque contrast was injected through the ureteral catheter and a retrograde pyelogram was performed with findings as dictated above.  A 0.38 sensor guidewire was then advanced up the right ureter into the renal pelvis under fluoroscopic guidance. The 6 Fr semirigid ureteroscope was then advanced into the ureter next to the guidewire and the calculus was identified.  The stone was able to be manipulated out of the distal aspect of the ureter.  Ureteroscopy confirmed no additional fragments.  The other stone seen on his CT was identified in the bladder and was also removed.  The wire was then backloaded through the cystoscope and a ureteral stent was advance over the wire using Seldinger technique.  The stent was positioned appropriately under fluoroscopic and cystoscopic guidance.  The wire was then removed with an adequate stent curl noted in the renal pelvis as well as in the bladder.  The bladder was then emptied and the procedure ended.  The patient appeared to tolerate the procedure well and without complications.  The patient was able to be awakened and transferred to the recovery unit in satisfactory condition.

## 2018-09-27 DIAGNOSIS — R1031 Right lower quadrant pain: Secondary | ICD-10-CM | POA: Diagnosis not present

## 2018-09-27 DIAGNOSIS — N201 Calculus of ureter: Secondary | ICD-10-CM

## 2018-09-27 DIAGNOSIS — N179 Acute kidney failure, unspecified: Secondary | ICD-10-CM

## 2018-09-27 DIAGNOSIS — R112 Nausea with vomiting, unspecified: Secondary | ICD-10-CM | POA: Diagnosis not present

## 2018-09-27 DIAGNOSIS — N132 Hydronephrosis with renal and ureteral calculous obstruction: Secondary | ICD-10-CM | POA: Diagnosis not present

## 2018-09-27 DIAGNOSIS — N1330 Unspecified hydronephrosis: Secondary | ICD-10-CM

## 2018-09-27 DIAGNOSIS — I10 Essential (primary) hypertension: Secondary | ICD-10-CM | POA: Diagnosis not present

## 2018-09-27 LAB — BASIC METABOLIC PANEL
Anion gap: 8 (ref 5–15)
BUN: 15 mg/dL (ref 8–23)
CO2: 23 mmol/L (ref 22–32)
Calcium: 8.6 mg/dL — ABNORMAL LOW (ref 8.9–10.3)
Chloride: 107 mmol/L (ref 98–111)
GFR calc Af Amer: 41 mL/min — ABNORMAL LOW (ref >60)
GFR calc non Af Amer: 36 mL/min — ABNORMAL LOW (ref >60)
Glucose, Bld: 221 mg/dL — ABNORMAL HIGH (ref 70–99)
Potassium: 4 mmol/L (ref 3.5–5.1)
Sodium: 138 mmol/L (ref 135–145)

## 2018-09-27 LAB — GLUCOSE, CAPILLARY: Glucose-Capillary: 191 mg/dL — ABNORMAL HIGH (ref 70–99)

## 2018-09-27 LAB — BASIC METABOLIC PANEL WITH GFR: Creatinine, Ser: 1.93 mg/dL — ABNORMAL HIGH (ref 0.61–1.24)

## 2018-09-27 NOTE — Discharge Summary (Signed)
Physician Discharge Summary  Michael Franco UJW:[id number] DOB: August 03, 1956 DOA: 09/25/2018  PCP: Johny Blamer, MD  Admit date: 09/25/2018 Discharge date: 09/27/2018  Admitted From: Home Disposition: Home  Recommendations for Outpatient Follow-up:  1. Follow up with PCP in 1 week with repeat CBC/BMP 2. Follow up in ED if symptoms worsen or new appear 3. Outpatient follow-up with urology   Home Health: No Equipment/Devices: None  Discharge Condition: Stable CODE STATUS: Full Diet recommendation: Heart healthy  Brief/Interim Summary: 63 year old male with history of diabetes mellitus type 2 on metformin, hypertension, gout presented with abdominal pain, nausea and vomiting.  He was found to have right distal ureteral stone.  He was transferred to [facility].  He underwent cystoscopy, right ureteroscopic stone removal and ureteral stent placement by urology.  Urology has cleared the patient for discharge.  Discharge Diagnoses:  Principal Problem:   Nausea and vomiting Active Problems:   Abdominal pain   Type 2 diabetes mellitus without complication (HCC)   Essential hypertension   AKI (acute kidney injury) (HCC)  Right distal ureteral stone and hydronephrosis -Status post cystoscopy, right ureteroscopic stone removal and right ureteral stent placement by urology on 09/26/2018.  Symptoms have much improved.  Urology has cleared the patient for discharge with outpatient follow-up with urology.  Discharge patient home today  Acute kidney injury -Unknown baseline in our system -Creatinine improving.  Repeat BMP as an outpatient within a week and follow-up with PCP as an outpatient.  Metformin and ACE inhibitor will remain on hold  Leukocytosis -From above.  Improving.  Outpatient follow-up  Hypertension -Resume home regimen except for ACE inhibitor.  Outpatient follow-up  Diabetes mellitus type 2 -Metformin will remain on hold.  Outpatient follow-up  Discharge  Instructions  Discharge Instructions    Call MD for:  persistant nausea and vomiting   Complete by:  As directed    Call MD for:  severe uncontrolled pain   Complete by:  As directed    Diet - low sodium heart healthy   Complete by:  As directed    Increase activity slowly   Complete by:  As directed      Allergies as of 09/27/2018   No Known Allergies     Medication List    STOP taking these medications   irbesartan 300 MG tablet Commonly known as:  AVAPRO   metFORMIN 1000 MG tablet Commonly known as:  GLUCOPHAGE     TAKE these medications   amLODipine 10 MG tablet Commonly known as:  NORVASC Take 10 mg by mouth daily.   aspirin EC 81 MG tablet Take 81 mg by mouth daily.   atenolol 50 MG tablet Commonly known as:  TENORMIN Take 50 mg by mouth daily.   doxazosin 4 MG tablet Commonly known as:  CARDURA Take 4 mg by mouth daily.      Follow-up Information    Heloise Purpura, MD Follow up.   Specialty:  Urology Why:  Will call to schedule for about 2-3 weeks for stent removal Contact information: [address] [place] [address] [phone]        Johny Blamer, MD. Schedule an appointment as soon as possible for a visit in 1 week(s).   Specialty:  Family Medicine Why:  with repeat cbc/bmp Contact information: [address] [place] [address] [phone]          No Known Allergies  Consultations:  Urology   Procedures/Studies: Ct Abdomen Pelvis Wo Contrast  Result Date: 09/25/2018 CLINICAL DATA:  Right-sided pain with nausea and vomiting since 3 a.m. EXAM: CT ABDOMEN AND PELVIS WITHOUT CONTRAST TECHNIQUE: Multidetector CT imaging of the abdomen and pelvis was performed following the standard protocol without IV contrast. COMPARISON:  07/22/2004 CTA FINDINGS: Lower chest: Clear lung bases. Normal heart size without pericardial or pleural effusion. Tiny hiatal hernia. Hepatobiliary: Normal liver. Normal gallbladder,  without biliary ductal dilatation. Pancreas: Normal, without mass or ductal dilatation. Spleen: Normal in size, without focal abnormality. Adrenals/Urinary Tract: Normal adrenal glands. Possible punctate lower pole left renal collecting system calculus. Moderate right sided perinephric edema with mild hydronephrosis and hydroureter. The hydroureter is followed to the level of the urinary bladder. There are 2 bladder stones, including at 3 mm in the right paracentral bladder base and 5 mm in the left bladder base. Stomach/Bowel: Normal remainder of the stomach. Normal colon, appendix, and terminal ileum. Normal small bowel. Vascular/Lymphatic: Aortic and branch vessel atherosclerosis. No abdominopelvic adenopathy. Reproductive: Mild prostatomegaly. Other: No significant free fluid. Periumbilical fat containing ventral abdominal wall laxity is tiny. Musculoskeletal: No acute osseous abnormality. IMPRESSION: 1. Two bladder calculi. These have presumably recently passed from the right kidney, given right perinephric edema and hydroureteronephrosis. 2. Possible left nephrolithiasis. 3.  Aortic Atherosclerosis (ICD10-I70.0). 4.  Tiny hiatal hernia. Electronically Signed   By: Jeronimo Greaves M.D.   On: 09/25/2018 09:10   Dg C-arm 1-60 Min-no Report  Result Date: 09/26/2018 Fluoroscopy was utilized by the requesting physician.  No radiographic interpretation.       Subjective: Patient seen and examined at bedside.  He feels much better and denies any current abdominal pain, nausea or vomiting.  He wants to go home.  Discharge Exam: Vitals:   09/27/18 0001 09/27/18 0435  BP: (!) 167/89 (!) 161/91  Pulse: 70 70  Resp: 18 18  Temp: 98.2 F (36.8 C) 98.2 F (36.8 C)  SpO2: 97% 96%   Vitals:   09/26/18 2015 09/26/18 2030 09/27/18 0001 09/27/18 0435  BP: (!) 182/93 (!) 154/82 (!) 167/89 (!) 161/91  Pulse:  71 70 70  Resp:  16 18 18   Temp: 98.2 F (36.8 C) 98 F (36.7 C) 98.2 F (36.8 C) 98.2 F (36.8  C)  TempSrc:  Oral Oral Oral  SpO2: 93% 95% 97% 96%  Weight:      Height:        General: Pt is alert, awake, not in acute distress Cardiovascular: rate controlled, S1/S2 + Respiratory: bilateral decreased breath sounds at bases Abdominal: Soft, NT, ND, bowel sounds + Extremities: no edema, no cyanosis    The results of significant diagnostics from this hospitalization (including imaging, microbiology, ancillary and laboratory) are listed below for reference.     Microbiology: Recent Results (from the past 240 hour(s))  MRSA PCR Screening     Status: None   Collection Time: 09/26/18  2:27 PM  Result Value Ref Range Status   MRSA by PCR NEGATIVE NEGATIVE Final    Comment:        The GeneXpert MRSA Assay (FDA approved for NASAL specimens only), is one component of a comprehensive MRSA colonization surveillance program. It is not intended to diagnose MRSA infection nor to guide or monitor treatment for MRSA infections. Performed at [facility], [address] [place], [place] [address]      Labs: BNP (last 3 results) No results for input(s): BNP in the last 8760 hours. Basic Metabolic Panel: Recent Labs  Lab 09/25/18 [phone]  09/26/18 0553 09/27/18 0431  NA 138 137 138  K 4.3 4.3 4.0  CL 102 106 107  CO2 22 23 23   GLUCOSE 244* 186* 221*  BUN 15 13 15   CREATININE 2.42* 2.75* 1.93*  CALCIUM 9.5 8.6* 8.6*   Liver Function Tests: Recent Labs  Lab 09/25/18 0643  AST 25  ALT 17  ALKPHOS 54  BILITOT 1.0  PROT 7.2  ALBUMIN 4.2   Recent Labs  Lab 09/25/18 0643  LIPASE 19   No results for input(s): AMMONIA in the last 168 hours. CBC: Recent Labs  Lab 09/25/18 0643 09/26/18 0553  WBC 15.0* 13.3*  HGB 12.2* 10.4*  HCT 36.4* 31.1*  MCV 89.9 88.9  PLT 241 210   Cardiac Enzymes: No results for input(s): CKTOTAL, CKMB, CKMBINDEX, TROPONINI in the last 168 hours. BNP: Invalid input(s): POCBNP CBG: Recent Labs  Lab 09/26/18 1130  09/26/18 1441 09/26/18 1615 09/26/18 1943 09/26/18 2126  GLUCAP 169* 177* 206* 179* 194*   D-Dimer No results for input(s): DDIMER in the last 72 hours. Hgb A1c No results for input(s): HGBA1C in the last 72 hours. Lipid Profile No results for input(s): CHOL, HDL, LDLCALC, TRIG, CHOLHDL, LDLDIRECT in the last 72 hours. Thyroid function studies No results for input(s): TSH, T4TOTAL, T3FREE, THYROIDAB in the last 72 hours.  Invalid input(s): FREET3 Anemia work up No results for input(s): VITAMINB12, FOLATE, FERRITIN, TIBC, IRON, RETICCTPCT in the last 72 hours. Urinalysis    Component Value Date/Time   COLORURINE YELLOW 09/25/2018 0926   APPEARANCEUR CLEAR 09/25/2018 0926   LABSPEC 1.014 09/25/2018 0926   PHURINE 7.0 09/25/2018 0926   GLUCOSEU 150 (A) 09/25/2018 0926   HGBUR NEGATIVE 09/25/2018 0926   BILIRUBINUR NEGATIVE 09/25/2018 0926   KETONESUR NEGATIVE 09/25/2018 0926   PROTEINUR 30 (A) 09/25/2018 0926   NITRITE NEGATIVE 09/25/2018 0926   LEUKOCYTESUR NEGATIVE 09/25/2018 0926   Sepsis Labs Invalid input(s): PROCALCITONIN,  WBC,  LACTICIDVEN Microbiology Recent Results (from the past 240 hour(s))  MRSA PCR Screening     Status: None   Collection Time: 09/26/18  2:27 PM  Result Value Ref Range Status   MRSA by PCR NEGATIVE NEGATIVE Final    Comment:        The GeneXpert MRSA Assay (FDA approved for NASAL specimens only), is one component of a comprehensive MRSA colonization surveillance program. It is not intended to diagnose MRSA infection nor to guide or monitor treatment for MRSA infections. Performed at [facility], [address], [place], [place] [address]      Time coordinating discharge: 35 minutes  SIGNED:   Glade Lloyd, MD  [facility] 09/27/2018, 8:57 AM

## 2018-09-27 NOTE — Anesthesia Postprocedure Evaluation (Signed)
Anesthesia Post Note  Patient: Michael Franco  Procedure(s) Performed: CYSTOSCOPY/URETEROSCOPY/RETROGRADE PYELOGRAM, STENT PLACEMENT,RIGHT (N/A Ureter)     Patient location during evaluation: PACU Anesthesia Type: General Level of consciousness: awake and alert Pain management: pain level controlled Vital Signs Assessment: post-procedure vital signs reviewed and stable Respiratory status: spontaneous breathing, nonlabored ventilation and respiratory function stable Cardiovascular status: blood pressure returned to baseline and stable Postop Assessment: no apparent nausea or vomiting Anesthetic complications: no    Last Vitals:                Kaylyn Layer

## 2018-09-27 NOTE — Progress Notes (Signed)
Patient ID: Michael Franco, male   DOB: Feb 22, 1956, 63 y.o.   MRN: [id number]  1 Day Post-Op Subjective: S/P right ureteroscopic stone removal last evening and ureteral stent placement.  Other stone was located in bladder and was removed.  Pain now resolved.  Objective: Vital signs in last 24 hours: Temp:  [98 F (36.7 C)-99.1 F (37.3 C)] 98.2 F (36.8 C) (02/25 0435) Pulse Rate:  [60-82] 70 (02/25 0435) Resp:  [14-18] 18 (02/25 0435) BP: (154-191)/(82-97) 161/91 (02/25 0435) SpO2:  [93 %-99 %] 96 % (02/25 0435) Weight:  [84.6 kg] 84.6 kg (02/24 1030)  Intake/Output from previous day: 02/24 0701 - 02/25 0700 In: 1973.2 [P.O.:360; I.V.:1513.2; IV Piggyback:100] Out: 2900 [Urine:2900] Intake/Output this shift: No intake/output data recorded.  Physical Exam:  General: Alert and oriented Abdomen: Soft, ND, No CVAT  Lab Results: Recent Labs    09/25/18 0643 09/26/18 0553  HGB 12.2* 10.4*  HCT 36.4* 31.1*   BMET Recent Labs    09/26/18 0553 09/27/18 0431  NA 137 138  K 4.3 4.0  CL 106 107  CO2 23 23  GLUCOSE 186* 221*  BUN 13 15  CREATININE 2.75* 1.93*  CALCIUM 8.6* 8.6*     Studies/Results:   Assessment/Plan: S/P ureteroscopic right ureteral stone removal and stent placement - Pt symptomatically improved and renal function now improving.  Ok for discharge from urologic perspective.  I will arrange f/u in about 2 weeks for stent removal and to recheck renal function and ensure return to baseline.   LOS: 0 days   Crecencio Mc 09/27/2018, 7:45 AM

## 2018-09-30 DIAGNOSIS — N200 Calculus of kidney: Secondary | ICD-10-CM | POA: Diagnosis not present

## 2018-09-30 DIAGNOSIS — E119 Type 2 diabetes mellitus without complications: Secondary | ICD-10-CM | POA: Diagnosis not present

## 2018-10-12 ENCOUNTER — Encounter (HOSPITAL_COMMUNITY): Payer: Self-pay | Admitting: Urology

## 2018-10-18 DIAGNOSIS — N201 Calculus of ureter: Secondary | ICD-10-CM | POA: Diagnosis not present

## 2018-12-02 DIAGNOSIS — N201 Calculus of ureter: Secondary | ICD-10-CM | POA: Diagnosis not present

## 2018-12-13 DIAGNOSIS — M10471 Other secondary gout, right ankle and foot: Secondary | ICD-10-CM | POA: Diagnosis not present

## 2022-04-15 ENCOUNTER — Other Ambulatory Visit: Payer: Self-pay | Admitting: Urology

## 2022-04-15 DIAGNOSIS — R972 Elevated prostate specific antigen [PSA]: Secondary | ICD-10-CM

## 2022-05-18 ENCOUNTER — Ambulatory Visit
Admission: RE | Admit: 2022-05-18 | Discharge: 2022-05-18 | Disposition: A | Discharge status: Home / Self Care | Payer: Medicare Other | Source: Ambulatory Visit | Attending: Urology | Admitting: Urology

## 2022-05-18 DIAGNOSIS — R972 Elevated prostate specific antigen [PSA]: Secondary | ICD-10-CM

## 2022-05-18 MED ORDER — GADOPICLENOL 0.5 MMOL/ML IV SOLN
9.0000 mL | Freq: Once | INTRAVENOUS | Status: AC | PRN
  Administered 2022-05-18: 9 mL via INTRAVENOUS

## 2022-08-07 DIAGNOSIS — Z23 Encounter for immunization: Secondary | ICD-10-CM | POA: Diagnosis not present

## 2022-08-10 DIAGNOSIS — R972 Elevated prostate specific antigen [PSA]: Secondary | ICD-10-CM | POA: Diagnosis not present

## 2022-08-18 DIAGNOSIS — N1832 Chronic kidney disease, stage 3b: Secondary | ICD-10-CM | POA: Diagnosis not present

## 2022-08-18 DIAGNOSIS — E119 Type 2 diabetes mellitus without complications: Secondary | ICD-10-CM | POA: Diagnosis not present

## 2022-08-18 DIAGNOSIS — M1009 Idiopathic gout, multiple sites: Secondary | ICD-10-CM | POA: Diagnosis not present

## 2022-08-18 DIAGNOSIS — E7800 Pure hypercholesterolemia, unspecified: Secondary | ICD-10-CM | POA: Diagnosis not present

## 2022-08-18 DIAGNOSIS — R42 Dizziness and giddiness: Secondary | ICD-10-CM | POA: Diagnosis not present

## 2022-08-18 DIAGNOSIS — I10 Essential (primary) hypertension: Secondary | ICD-10-CM | POA: Diagnosis not present

## 2022-08-18 DIAGNOSIS — Z23 Encounter for immunization: Secondary | ICD-10-CM | POA: Diagnosis not present

## 2023-01-05 DIAGNOSIS — N1832 Chronic kidney disease, stage 3b: Secondary | ICD-10-CM | POA: Diagnosis not present

## 2023-01-05 DIAGNOSIS — I10 Essential (primary) hypertension: Secondary | ICD-10-CM | POA: Diagnosis not present

## 2023-01-05 DIAGNOSIS — E1165 Type 2 diabetes mellitus with hyperglycemia: Secondary | ICD-10-CM | POA: Diagnosis not present

## 2023-02-03 DIAGNOSIS — R972 Elevated prostate specific antigen [PSA]: Secondary | ICD-10-CM | POA: Diagnosis not present

## 2023-02-10 DIAGNOSIS — R972 Elevated prostate specific antigen [PSA]: Secondary | ICD-10-CM | POA: Diagnosis not present

## 2023-02-19 DIAGNOSIS — E7800 Pure hypercholesterolemia, unspecified: Secondary | ICD-10-CM | POA: Diagnosis not present

## 2023-02-19 DIAGNOSIS — N1832 Chronic kidney disease, stage 3b: Secondary | ICD-10-CM | POA: Diagnosis not present

## 2023-02-19 DIAGNOSIS — M1009 Idiopathic gout, multiple sites: Secondary | ICD-10-CM | POA: Diagnosis not present

## 2023-02-19 DIAGNOSIS — E1165 Type 2 diabetes mellitus with hyperglycemia: Secondary | ICD-10-CM | POA: Diagnosis not present

## 2023-02-19 DIAGNOSIS — Z0000 Encounter for general adult medical examination without abnormal findings: Secondary | ICD-10-CM | POA: Diagnosis not present

## 2023-02-19 DIAGNOSIS — I10 Essential (primary) hypertension: Secondary | ICD-10-CM | POA: Diagnosis not present

## 2023-05-18 DIAGNOSIS — M109 Gout, unspecified: Secondary | ICD-10-CM | POA: Diagnosis not present

## 2023-05-18 DIAGNOSIS — N184 Chronic kidney disease, stage 4 (severe): Secondary | ICD-10-CM | POA: Diagnosis not present

## 2023-05-18 DIAGNOSIS — R809 Proteinuria, unspecified: Secondary | ICD-10-CM | POA: Diagnosis not present

## 2023-05-18 DIAGNOSIS — D631 Anemia in chronic kidney disease: Secondary | ICD-10-CM | POA: Diagnosis not present

## 2023-05-18 DIAGNOSIS — N2581 Secondary hyperparathyroidism of renal origin: Secondary | ICD-10-CM | POA: Diagnosis not present

## 2023-05-18 DIAGNOSIS — Z23 Encounter for immunization: Secondary | ICD-10-CM | POA: Diagnosis not present

## 2023-05-18 DIAGNOSIS — E1122 Type 2 diabetes mellitus with diabetic chronic kidney disease: Secondary | ICD-10-CM | POA: Diagnosis not present

## 2023-05-18 DIAGNOSIS — N189 Chronic kidney disease, unspecified: Secondary | ICD-10-CM | POA: Diagnosis not present

## 2023-05-18 DIAGNOSIS — G4733 Obstructive sleep apnea (adult) (pediatric): Secondary | ICD-10-CM | POA: Diagnosis not present

## 2023-05-18 DIAGNOSIS — I129 Hypertensive chronic kidney disease with stage 1 through stage 4 chronic kidney disease, or unspecified chronic kidney disease: Secondary | ICD-10-CM | POA: Diagnosis not present

## 2023-05-18 DIAGNOSIS — N181 Chronic kidney disease, stage 1: Secondary | ICD-10-CM | POA: Diagnosis not present

## 2023-06-10 DIAGNOSIS — N1832 Chronic kidney disease, stage 3b: Secondary | ICD-10-CM | POA: Diagnosis not present

## 2023-06-10 DIAGNOSIS — E1165 Type 2 diabetes mellitus with hyperglycemia: Secondary | ICD-10-CM | POA: Diagnosis not present

## 2023-06-10 DIAGNOSIS — M1009 Idiopathic gout, multiple sites: Secondary | ICD-10-CM | POA: Diagnosis not present

## 2023-06-10 DIAGNOSIS — E7800 Pure hypercholesterolemia, unspecified: Secondary | ICD-10-CM | POA: Diagnosis not present

## 2023-06-10 DIAGNOSIS — I10 Essential (primary) hypertension: Secondary | ICD-10-CM | POA: Diagnosis not present

## 2023-06-14 DIAGNOSIS — E7800 Pure hypercholesterolemia, unspecified: Secondary | ICD-10-CM | POA: Diagnosis not present

## 2023-06-14 DIAGNOSIS — E1165 Type 2 diabetes mellitus with hyperglycemia: Secondary | ICD-10-CM | POA: Diagnosis not present

## 2023-06-15 DIAGNOSIS — I129 Hypertensive chronic kidney disease with stage 1 through stage 4 chronic kidney disease, or unspecified chronic kidney disease: Secondary | ICD-10-CM | POA: Diagnosis not present

## 2023-06-15 DIAGNOSIS — N184 Chronic kidney disease, stage 4 (severe): Secondary | ICD-10-CM | POA: Diagnosis not present

## 2023-11-22 DIAGNOSIS — N2581 Secondary hyperparathyroidism of renal origin: Secondary | ICD-10-CM | POA: Diagnosis not present

## 2023-11-22 DIAGNOSIS — M109 Gout, unspecified: Secondary | ICD-10-CM | POA: Diagnosis not present

## 2023-11-22 DIAGNOSIS — E1122 Type 2 diabetes mellitus with diabetic chronic kidney disease: Secondary | ICD-10-CM | POA: Diagnosis not present

## 2023-11-22 DIAGNOSIS — G4733 Obstructive sleep apnea (adult) (pediatric): Secondary | ICD-10-CM | POA: Diagnosis not present

## 2023-11-22 DIAGNOSIS — R809 Proteinuria, unspecified: Secondary | ICD-10-CM | POA: Diagnosis not present

## 2023-11-22 DIAGNOSIS — N184 Chronic kidney disease, stage 4 (severe): Secondary | ICD-10-CM | POA: Diagnosis not present

## 2023-11-22 DIAGNOSIS — D631 Anemia in chronic kidney disease: Secondary | ICD-10-CM | POA: Diagnosis not present

## 2023-11-22 DIAGNOSIS — I129 Hypertensive chronic kidney disease with stage 1 through stage 4 chronic kidney disease, or unspecified chronic kidney disease: Secondary | ICD-10-CM | POA: Diagnosis not present

## 2023-11-22 DIAGNOSIS — N189 Chronic kidney disease, unspecified: Secondary | ICD-10-CM | POA: Diagnosis not present

## 2023-12-24 DIAGNOSIS — N184 Chronic kidney disease, stage 4 (severe): Secondary | ICD-10-CM | POA: Diagnosis not present

## 2024-01-12 DIAGNOSIS — E1165 Type 2 diabetes mellitus with hyperglycemia: Secondary | ICD-10-CM | POA: Diagnosis not present

## 2024-01-12 DIAGNOSIS — I10 Essential (primary) hypertension: Secondary | ICD-10-CM | POA: Diagnosis not present

## 2024-01-12 DIAGNOSIS — Z8739 Personal history of other diseases of the musculoskeletal system and connective tissue: Secondary | ICD-10-CM | POA: Diagnosis not present

## 2024-01-12 DIAGNOSIS — N184 Chronic kidney disease, stage 4 (severe): Secondary | ICD-10-CM | POA: Diagnosis not present

## 2024-01-12 DIAGNOSIS — E7800 Pure hypercholesterolemia, unspecified: Secondary | ICD-10-CM | POA: Diagnosis not present

## 2024-02-16 DIAGNOSIS — G4733 Obstructive sleep apnea (adult) (pediatric): Secondary | ICD-10-CM | POA: Diagnosis not present

## 2024-02-16 DIAGNOSIS — D631 Anemia in chronic kidney disease: Secondary | ICD-10-CM | POA: Diagnosis not present

## 2024-02-16 DIAGNOSIS — N184 Chronic kidney disease, stage 4 (severe): Secondary | ICD-10-CM | POA: Diagnosis not present

## 2024-02-16 DIAGNOSIS — E1122 Type 2 diabetes mellitus with diabetic chronic kidney disease: Secondary | ICD-10-CM | POA: Diagnosis not present

## 2024-02-16 DIAGNOSIS — M109 Gout, unspecified: Secondary | ICD-10-CM | POA: Diagnosis not present

## 2024-02-16 DIAGNOSIS — N2581 Secondary hyperparathyroidism of renal origin: Secondary | ICD-10-CM | POA: Diagnosis not present

## 2024-02-16 DIAGNOSIS — N189 Chronic kidney disease, unspecified: Secondary | ICD-10-CM | POA: Diagnosis not present

## 2024-02-16 DIAGNOSIS — R809 Proteinuria, unspecified: Secondary | ICD-10-CM | POA: Diagnosis not present

## 2024-02-16 DIAGNOSIS — I129 Hypertensive chronic kidney disease with stage 1 through stage 4 chronic kidney disease, or unspecified chronic kidney disease: Secondary | ICD-10-CM | POA: Diagnosis not present

## 2024-02-21 DIAGNOSIS — R972 Elevated prostate specific antigen [PSA]: Secondary | ICD-10-CM | POA: Diagnosis not present

## 2024-02-21 DIAGNOSIS — N5201 Erectile dysfunction due to arterial insufficiency: Secondary | ICD-10-CM | POA: Diagnosis not present

## 2024-03-01 DIAGNOSIS — I10 Essential (primary) hypertension: Secondary | ICD-10-CM | POA: Diagnosis not present

## 2024-05-23 DIAGNOSIS — R972 Elevated prostate specific antigen [PSA]: Secondary | ICD-10-CM | POA: Diagnosis not present

## 2024-06-28 DIAGNOSIS — E7800 Pure hypercholesterolemia, unspecified: Secondary | ICD-10-CM | POA: Diagnosis not present

## 2024-06-28 DIAGNOSIS — Z8739 Personal history of other diseases of the musculoskeletal system and connective tissue: Secondary | ICD-10-CM | POA: Diagnosis not present

## 2024-06-28 DIAGNOSIS — Z1211 Encounter for screening for malignant neoplasm of colon: Secondary | ICD-10-CM | POA: Diagnosis not present

## 2024-06-28 DIAGNOSIS — I10 Essential (primary) hypertension: Secondary | ICD-10-CM | POA: Diagnosis not present

## 2024-06-28 DIAGNOSIS — Z23 Encounter for immunization: Secondary | ICD-10-CM | POA: Diagnosis not present

## 2024-06-28 DIAGNOSIS — Z0000 Encounter for general adult medical examination without abnormal findings: Secondary | ICD-10-CM | POA: Diagnosis not present

## 2024-06-28 DIAGNOSIS — E1165 Type 2 diabetes mellitus with hyperglycemia: Secondary | ICD-10-CM | POA: Diagnosis not present

## 2024-06-28 DIAGNOSIS — N184 Chronic kidney disease, stage 4 (severe): Secondary | ICD-10-CM | POA: Diagnosis not present

## 2024-07-13 DIAGNOSIS — N189 Chronic kidney disease, unspecified: Secondary | ICD-10-CM | POA: Diagnosis not present

## 2024-07-13 DIAGNOSIS — N2581 Secondary hyperparathyroidism of renal origin: Secondary | ICD-10-CM | POA: Diagnosis not present

## 2024-07-13 DIAGNOSIS — M109 Gout, unspecified: Secondary | ICD-10-CM | POA: Diagnosis not present

## 2024-07-13 DIAGNOSIS — R809 Proteinuria, unspecified: Secondary | ICD-10-CM | POA: Diagnosis not present

## 2024-07-13 DIAGNOSIS — E1122 Type 2 diabetes mellitus with diabetic chronic kidney disease: Secondary | ICD-10-CM | POA: Diagnosis not present

## 2024-07-13 DIAGNOSIS — N184 Chronic kidney disease, stage 4 (severe): Secondary | ICD-10-CM | POA: Diagnosis not present
